# Patient Record
Sex: Male | Born: 1955
Health system: Western US, Academic
[De-identification: ages and names within clinical notes are randomized; demographics above are authoritative.]

---

## 2013-06-29 ENCOUNTER — Other Ambulatory Visit: Payer: Self-pay | Admitting: Family Medicine

## 2013-06-29 ENCOUNTER — Ambulatory Visit
Admission: RE | Admit: 2013-06-29 | Discharge: 2013-06-29 | Disposition: A | Discharge status: Home / Self Care | Payer: Managed Care, Other (non HMO) | Source: Ambulatory Visit | Attending: Family Medicine | Admitting: Family Medicine

## 2013-06-29 DIAGNOSIS — S63501A Unspecified sprain of right wrist, initial encounter: Secondary | ICD-10-CM

## 2014-08-04 DIAGNOSIS — R569 Unspecified convulsions: Secondary | ICD-10-CM

## 2015-11-18 DIAGNOSIS — R251 Tremor, unspecified: Secondary | ICD-10-CM

## 2017-04-19 DIAGNOSIS — Z6831 Body mass index (BMI) 31.0-31.9, adult: Secondary | ICD-10-CM

## 2017-10-31 DIAGNOSIS — E538 Deficiency of other specified B group vitamins: Secondary | ICD-10-CM

## 2018-03-10 DIAGNOSIS — Z7689 Persons encountering health services in other specified circumstances: Secondary | ICD-10-CM

## 2018-03-10 NOTE — Progress Notes
Patient: SRIHAAN MASTRANGELO  MRN: [id number]  Date of Service: 03/10/2018  PCP : No primary care provider on file.      CC:   Chief Complaint   Patient presents with   ??? Establish Care     headache x 2 months      History of Present Illness:     UZAIR GODLEY is a 62 y.o. male with  has no past medical history on file. presented to establish care    Patient complaint of constant headache for 2 months   Started while travelling in [place] and had a cold.   Since then has persistent pressure like headache on the bilateral temporal region.   Always have stress at work.   + neck pain.   No neurologic changes.   No blurry vision.   Ibuprofen help when taking it.     No hx of hypertension  Has also noted some hand tremors lately.   Family hx of brain tumor in father and uncles.       Patient has following chronic medical conditions:  1. Epilepsy : Grand mal since teenager.     On depakote 250 mg one in am and 2 in pm  Not follow by neurologist currently   Last visit about 10-15 yrs.   Stable on the medication.   No episodes.   Last valproic acid 55  10/2017      2. Hyperlipidemia  On lipitor 20 mg po qd  Last lab 4 month ago.   10/2017  TC 138  TG 122  LDL 74  HDL 40      3. Gout: wrist  allopuranol 300mg  po qd  No problem since on medication.       4. IBS with diarrhea    Was seen by Dr. Michael Litter  On metamucil    5. prediabete  Not on medication.   A1c : 5.9     6. Hx of colon polyp  04/29/2017 screening  Test : neg   09/11/2011 colonoscopy  Closer colonoscopy? 2-3 yrs ago.   Need record    7. Family hx of brain tumor.   Father at age 55s.   Uncle   Cousin.            10/2017 outside lab  Normal CMP  Normal CBC  TSH 2.8  Low normal b12 388  Normal folate level   Vitamin D = 41  FIT test 10/2017 = normal       Preventive Health Assessment:     Exercise is not adequate.    Seatbelts are used at all times.  Wynelle Link protection is used.  Tetanus/ immunization:   Vaccines:     Pneumovax    Prevnar    Zostavax    TDap Health Maintenance   Topic Date Due   ??? Hepatitis C Screening  10/20/1974   ??? HIV Screening  10/20/1974   ??? Shingles (Shingrix) Vaccine (1 of 2) 10/20/2006   ??? Annual Preventive Wellness Visit  10/20/2006   ??? Colon Ca Screening: COLONOSCOPY  10/20/2006   ??? Influenza Vaccine (1) 11/24/2017   ??? Tdap/Td Vaccine (3 - Td) 10/16/2026                       Past Medical History:      Past Medical History:   Diagnosis Date   ??? Epilepsy (HCC/RAF)    ??? Gout    ??? Hx of colonic polyp    ???  Hyperlipidemia    ??? IBS (irritable bowel syndrome)     diarrhea   ??? Prediabetes        Past Surgical History:      Past Surgical History:   Procedure Laterality Date   ??? TONSILLECTOMY         Allergy:  No Known Allergies     Medication:       Medications that the patient states to be currently taking   Medication Sig   ??? allopurinol 300 mg tablet TAKE 1 TABLET DAILY   ??? B Complex Vitamins (VITAMIN B COMPLEX) capsule Take 1 capsule by mouth daily.   ??? CALCIUM-VITAMIN D PO Take by mouth.   ??? cholecalciferol 10 mcg (400 units) tablet Take 400 Units by mouth daily.   ??? cyanocobalamin 1000 mcg tablet Take 1,000 mcg by mouth.   ??? divalproex 250 mg 24 hr tablet TAKE 1 TABLET 3 TIMES A DAY   ??? ferrous sulfate 325 (65 FE) mg EC tablet Take 325 mg by mouth three (3) times daily with meals.   ??? fish oil 1000 mg capsule Take by mouth.   ??? hyoscyamine 0.125 mg tablet Take 0.125 mg by mouth.   ??? ketoconazole 2% cream    ??? multivitamin tablet Take by mouth daily.   ??? simvastatin 20 mg tablet TAKE 1 TABLET NIGHTLY         Family History:   Family History   Problem Relation Age of Onset   ??? Depression Mother    ??? Meniere's disease Mother    ??? Brain cancer Father 66          Social History:      Social History     Patient does not qualify to have social determinant information on file (likely too young).   Social History Narrative    Married with step daughter.         Born in [place] to [place] 1991    Since then in [place] Work as Emergency planning/management officer in [employer].     Does lots of international travel before.               Review of Systems:    Review of Systems   Constitutional: Negative.    HENT: Negative.    Eyes: Negative.    Respiratory: Negative.    Cardiovascular: Negative.    Gastrointestinal: Negative.    Genitourinary: Negative.    Musculoskeletal: Negative.    Skin: Negative.    Neurological: Positive for tremors and headaches.   Endo/Heme/Allergies: Negative.    Psychiatric/Behavioral: Negative.            Physical Exam:     BP 146/78  ~ Pulse 80  ~ Temp 36.9 ???C (98.5 ???F) (Oral)  ~ Resp 16  ~ Ht 6' 0.32'' (1.837 m)  ~ Wt (!) 242 lb 12.8 oz (110.1 kg)  ~ SpO2 95%  ~ BMI 32.64 kg/m???       Physical Exam   Constitutional: He is oriented to person, place, and time and well-developed, well-nourished, and in no distress. No distress.   HENT:   Head: Normocephalic and atraumatic.   Neck: Muscular tenderness present. No spinous process tenderness present.       Cardiovascular: Normal rate, regular rhythm, normal heart sounds and normal pulses. Exam reveals no gallop.   No murmur heard.  Pulmonary/Chest: Effort normal and breath sounds normal. He has no decreased breath sounds. He has  no wheezes. He has no rhonchi. He has no rales.   Abdominal: Soft. Normal appearance and bowel sounds are normal. There is no hepatosplenomegaly. There is no abdominal tenderness. There is no rigidity, no rebound, no guarding, no CVA tenderness, no tenderness at McBurney's point and negative Murphy's sign. No hernia.   Musculoskeletal:      Thoracic back: Normal.      Lumbar back: Normal.   Neurological: He is alert and oriented to person, place, and time. He has normal sensation, normal strength, normal reflexes and intact cranial nerves. Gait normal.   Skin: Skin is warm and intact.   Psychiatric: Mood and affect normal.        LABS:    No results found for: HGBA1C    No results found for: VITD25OH No results found for: WBC, HGB, HCT, MCV, PLT  No results found for: CREAT, BUN, NA, K, CL, CO2  No results found for: HGBA1C  No results found for: ALT, AST, GGT, ALKPHOS, BILITOT  No results found for: TSH  No results found for: CALCIUM, PHOS  No results found for: CHOL, CHOLHDL, CHOLDLCAL, CHOLDLQ, TRIGLY  No results found for: INR, INRPOC, PT  Outside lab revied  10/2017 outside lab  Normal CMP  Normal CBC  TSH 2.8  Low normal b12 388  Normal folate level   Vitamin D = 41  FIT test 10/2017 = normal I have   [] reviewed radiology,  [x] reviewed labs, [] reviewed diag med test, [] reviewed & summarized old records, [] requested outside medical records.      IMPRESSION/PLAN:       1. Encounter to establish care with new doctor  - history review with patient  - obtain old record   - chart record, studies reviewed      2. Elevated BP without diagnosis of hypertension  + stress   I have discussed the potential causes, evaluation and treatment management in detail with patient.  - BP log at home  - dash diet  - avoid caffeinated drink and food.   - routine exercise  - if increase bp, will consider start medication.       3. Grand mal epilepsy, controlled (HCC/RAF)  Stable.   Continue with depakote  - Basic Metabolic Panel; Future  - MR brain wo+w contrast; Future  - TBOC - Venipuncture w/Collection & Handling  - Basic Metabolic Panel    4. Headache, unspecified headache type  For 2 months  Unclear etiology: may be multifactorial with tension headache vs CNS etiology.   Neurologic appears non focal on exam.   Currently no tremor noted.      Given history of seizure, family hx of brain tumor in father and hand tremor, will need to r/o cns etiology with imaging  Warm compress  nsaids prn pain.   ER warning signs discussed    - Basic Metabolic Panel; Future  - MR brain wo+w contrast; Future  - TBOC - Venipuncture w/Collection & Handling  - Basic Metabolic Panel    5. Tremor of both hands  Intermittent and new. - Basic Metabolic Panel; Future  - MR brain wo+w contrast; Future  - TBOC - Venipuncture w/Collection & Handling  - Basic Metabolic Panel    6. FH: brain tumor   father , uncle and cousin.     - Basic Metabolic Panel; Future  - MR brain wo+w contrast; Future  - TBOC - Venipuncture w/Collection & Handling  - Basic Metabolic Panel    7. Mixed  hyperlipidemia  Stable.   Continue with simvastatin     8. Idiopathic gout of wrist, unspecified chronicity, unspecified laterality  Stable   Continue with allopurinol     9. Prediabetes  Stable.   Diet control     10. Hx of colonic polyp  Last FIT test 10/2017.   Need colonoscopy info     11. Screening for HIV (human immunodeficiency virus)     - HIV-1/2 Ag/Ab 4th Generation with Reflex Confirmation; Future  - HIV-1/2 Ag/Ab 4th Generation with Reflex Confirmation    12. Need for hepatitis C screening test     - HCV Antibody Screen; Future  - HCV Antibody Screen    13. Need for vaccination for zoster  Prescription provided   - zoster vac recomb adjuvanted (SHINGRIX) 50 mcg injection; Administer 50 MCG IM now and repeat in two months.  Dispense: 1 each; Refill: 1    14. Need for influenza vaccination     - Influenza vaccine IM;   PF             RTC:  Return in about 4 weeks (around 04/08/2018) for follow up blood pressure, 30 minutes follow up.    Today's visit lasted more than 35 minutes of which greater than 50% was spent in counseling, coordination of care and a detailed question and answer session regarding the pathophysiology, etiology, and treatment options available including the risks and benefits related to the medical issues pertinent to this encounter.   The above plan of care, diagnosis, orders, and follow-up were discussed with the patient.  Questions related to this recommended plan of care were answered.              Woodfin Kiss S. Regino Schultze, MD 03/11/2018 5:55 PM  Assistant Clinical Professor  Internal Medicine  [facility] [address]  [place], [place] [address]  Office:[phone]

## 2018-03-11 ENCOUNTER — Ambulatory Visit: Payer: BLUE CROSS/BLUE SHIELD

## 2018-03-11 DIAGNOSIS — M10039 Idiopathic gout, unspecified wrist: Secondary | ICD-10-CM

## 2018-03-11 DIAGNOSIS — R251 Tremor, unspecified: Secondary | ICD-10-CM

## 2018-03-11 DIAGNOSIS — G40409 Other generalized epilepsy and epileptic syndromes, not intractable, without status epilepticus: Secondary | ICD-10-CM

## 2018-03-11 DIAGNOSIS — R030 Elevated blood-pressure reading, without diagnosis of hypertension: Secondary | ICD-10-CM

## 2018-03-11 DIAGNOSIS — E782 Mixed hyperlipidemia: Secondary | ICD-10-CM

## 2018-03-11 DIAGNOSIS — Z8489 Family history of other specified conditions: Secondary | ICD-10-CM

## 2018-03-11 DIAGNOSIS — Z1159 Encounter for screening for other viral diseases: Secondary | ICD-10-CM

## 2018-03-11 DIAGNOSIS — Z114 Encounter for screening for human immunodeficiency virus [HIV]: Secondary | ICD-10-CM

## 2018-03-11 DIAGNOSIS — Z86010 Personal history of colonic polyps: Secondary | ICD-10-CM

## 2018-03-11 DIAGNOSIS — R51 Headache: Secondary | ICD-10-CM

## 2018-03-11 DIAGNOSIS — Z23 Encounter for immunization: Secondary | ICD-10-CM

## 2018-03-11 DIAGNOSIS — R7303 Prediabetes: Secondary | ICD-10-CM

## 2018-03-11 MED ORDER — ZOSTER VAC RECOMB ADJUVANTED 50 MCG/0.5ML IM SUSR
1 refills | Status: AC
Start: 2018-03-11 — End: ?

## 2018-03-11 NOTE — Patient Instructions
[facility] allows you to send messages to your care team, view your test results, renew your prescriptions, schedule appointments, and more. To sign up, log on to [email or web address]. Once you are logged on, click on the Sign Up Now link and you will access the new member sign-up page. Enter your [email or web address] Activation Code exactly as it appears below to complete the sign-up process. If you do not sign up before the expiration date, you must request a new code.    [facility] Activation Code: [email or web address]  Expires: 05/10/2018  3:45 PM    If you have questions, call our Patient Support Line at [phone]. Remember, [facility] is NOT to be used for urgent needs. For medical emergencies, dial 911.      Thank you for your visit.   My regular work hours are Tues, Thursday and Friday from 8:00am - 5:00 pm.    If you have any non urgent messages, please send me a message either through email or call.  I will get back to you on my clinic days.   If you need to be seen when I am not available, the call center will assist you to find an available [facility] physician to help with your health concern/issues.      [facility]:  Extended hour service     Monday to Friday 5:00 pm-9:00 pm  Saturday 9:00am to 1:00pm  Call for holiday hours  [address]  [place], [place] [address]  [phone]        .I have ordered MRI brain for further evaluation.       Please call Radiology at to make an appointment at any of the following location and number.    [phone]  For general [place] evaluation at following location        [phone] [facility] Radiology  [address]  [facility] and [facility]    [address]  [place], [place] [address]          [facility] Radiology Central Scheduling   [phone]   Hours of Operation:   7:00 am - 7:00 pm, Mon - Fri   [email or web address] Your blood pressure is elevated this visit:    -  Please monitor your blood pressure at home, and bring your blood pressure log to your next visit  -  The best time to check your blood pressure is either first in the morning after urinating, or right before bedtime  - Check BP once a day, no more than twice a day  -  Try decreasing the amount of salt in your diet (increased salt leads to water retention)-- Esp watch dining out and processed foods!  -  Do your best to exercise regularly. You should aim for at least 30 mins of brisk exercise, 3-5 times a week (pushing yourself so that you break a sweat or become breathless)      Headache, Unspecified    A number of things can cause headaches. The cause of your headache isn???t clear. But it doesn???t seem to be a sign of any serious illness.  You could have a tension headache or a migraine headache.  Stress can cause a tension headache. This can happen if you tense the muscles of your shoulders, neck, and scalp without knowing it. If this stress lasts long enough, you may develop a tension headache.  It is not clear why  migraines occur, but certain things called'' triggers'' can raise the risk of having a migraine attack. Migraine triggers may include emotional stress or depression, or by hormone changes during the menstrual cycle. Other triggers include birth control pills and other medicines, alcohol or caffeine, foods with tyramine (such as aged cheese, wine), eyestrain, weather changes, missed meals, and lack of sleep or oversleeping.  Other causes of headache include:  ??? Viral illness with high fever  ??? Head injury with concussion  ??? Sinus, ear, or throat infection  ??? Dental pain and jaw joint (TMJ) pain  More serious but less common causes of headache include stroke, brain hemorrhage, brain tumor, meningitis, and encephalitis.  Home care  Follow these tips when taking care of yourself at home:  ??? Don???t drive yourself home if you were given pain medicine for your headache. Instead, have someone else drive you home. Try to sleep when you get home. You should feel much better when you wake up.  ??? Apply heat to the back of your neck to ease a neck muscle spasm. Take care of a migraine headache by putting an ice pack on your forehead or at the base of your skull.  ??? If you have nausea or vomiting, eat a light diet until your headache eases.  ??? If you have a migraine headache, use sunglasses when in the daylight or around bright indoor lighting until your symptoms get better. Bright glaring light can make this type of headache worse.  Follow-up care  Follow up with your healthcare provider, or as advised. Talk with your provider if you have frequent headaches. He or she can help figure out a treatment plan. By knowing the earliest signs of headache, and starting treatment right away, you may be able to stop the pain yourself.  When to seek medical advice  Call your healthcare provider right away???if any of these occur:  ??? Your head pain suddenly gets worse after sexual intercourse or strenuous activity  ??? Your head pain doesn???t get better within 24 hours  ??? You aren???t able to keep liquids down (repeated vomiting)  ??? Fever of 100.4???F (38???C) or higher, or as directed by your healthcare provider  ??? Stiff neck  ??? Extreme drowsiness, confusion, or fainting  ??? Dizziness or dizziness with spinning sensation (vertigo)  ??? Weakness in an arm or leg or one side of your face  ??? You have trouble talking or seeing  Date Last Reviewed: 10/25/2014  ??? 2000-2018 [employer], [place]. [address] [place], [place] [address]. All rights reserved. This information is not intended as a substitute for professional medical care. Always follow your healthcare professional's instructions.

## 2018-03-12 LAB — Basic Metabolic Panel
ANION GAP: 16 mmol/L (ref 8–19)
TOTAL CO2: 23 mmol/L (ref 20–30)

## 2018-03-12 LAB — HIV-1/2 Ag/Ab 4th Generation with Reflex Confirmation: HIV-1/2 AG/AB 4TH GENERATION WITH REFLEX CONFIRMATION: NONREACTIVE

## 2018-03-12 LAB — HCV Ab Screen: HCV ANTIBODY SCREEN: NONREACTIVE

## 2018-03-12 NOTE — Nursing Note
Per doctors orders, patient received Fluarix Quadrivalent vaccine. Patient tolerated well, no pain noted or stated by patient. Right arm area.     Provided patient with Fluarix Quadrivalent VIS.

## 2018-04-22 ENCOUNTER — Ambulatory Visit: Payer: BLUE CROSS/BLUE SHIELD

## 2018-04-23 ENCOUNTER — Ambulatory Visit: Payer: BLUE CROSS/BLUE SHIELD

## 2018-04-23 ENCOUNTER — Inpatient Hospital Stay: Payer: BLUE CROSS/BLUE SHIELD

## 2018-04-23 DIAGNOSIS — Z8489 Family history of other specified conditions: Secondary | ICD-10-CM

## 2018-04-23 DIAGNOSIS — R251 Tremor, unspecified: Secondary | ICD-10-CM

## 2018-04-23 DIAGNOSIS — R51 Headache: Secondary | ICD-10-CM

## 2018-04-23 DIAGNOSIS — G40409 Other generalized epilepsy and epileptic syndromes, not intractable, without status epilepticus: Secondary | ICD-10-CM

## 2018-04-23 MED ORDER — DIVALPROEX SODIUM ER 250 MG PO TB24
ORAL_TABLET | 1 refills | Status: AC
Start: 2018-04-23 — End: 2018-11-16

## 2018-04-23 MED ORDER — SIMVASTATIN 20 MG PO TABS
ORAL_TABLET | 1 refills | Status: AC
Start: 2018-04-23 — End: 2018-11-16

## 2018-04-23 MED ORDER — ALLOPURINOL 300 MG PO TABS
ORAL_TABLET | 1 refills | Status: AC
Start: 2018-04-23 — End: 2018-11-16

## 2018-04-24 ENCOUNTER — Ambulatory Visit: Payer: BLUE CROSS/BLUE SHIELD

## 2018-04-24 MED ADMIN — GADOBUTROL 1 MMOL/ML IV SOLN: 10 mL | INTRAVENOUS | @ 01:00:00 | Stop: 2018-04-24 | NDC 50419032512

## 2018-09-01 ENCOUNTER — Ambulatory Visit: Payer: BLUE CROSS/BLUE SHIELD

## 2018-09-01 DIAGNOSIS — Z86010 Personal history of colonic polyps: Secondary | ICD-10-CM

## 2018-09-01 DIAGNOSIS — K589 Irritable bowel syndrome without diarrhea: Secondary | ICD-10-CM

## 2018-09-01 NOTE — Telephone Encounter
Referral has been faxed to GI office-digestive disease at [phone].

## 2018-09-01 NOTE — Telephone Encounter
Dr.Wang,  Do you need an appointment prior to referral? Last seen 02/2018.

## 2018-09-11 ENCOUNTER — Ambulatory Visit: Payer: BLUE CROSS/BLUE SHIELD

## 2018-09-12 ENCOUNTER — Telehealth: Payer: BLUE CROSS/BLUE SHIELD

## 2018-09-12 ENCOUNTER — Ambulatory Visit: Payer: BLUE CROSS/BLUE SHIELD

## 2018-09-12 DIAGNOSIS — R6889 Other general symptoms and signs: Secondary | ICD-10-CM

## 2018-09-12 DIAGNOSIS — J029 Acute pharyngitis, unspecified: Secondary | ICD-10-CM

## 2018-09-12 MED ORDER — AZITHROMYCIN 250 MG PO TABS
ORAL_TABLET | 0 refills | Status: AC
Start: 2018-09-12 — End: ?

## 2018-09-12 NOTE — Progress Notes
The coronavirus prepareness and response supplemental appropriation Act of 2020, this visit was conducted via telecommunication via phone/ video with patient's verbal consent after verifying their name and date of birth.    Patient Consent to Telehealth Questionnaire   [facility] TELEHEALTH PRECHECKIN QUESTIONS 09/11/2018   By clicking ''I Agree'', I consent to the below:  I Agree     - I agree  to be treated via a video visit and acknowledge that I may be liable for any relevant copays or coinsurance depending on my insurance plan.  - I understand that this video visit is offered for my convenience and I am able to cancel and reschedule for an in-person appointment if I desire.  - I also acknowledge that sensitive medical information may be discussed during this video visit appointment and that it is my responsibility to locate myself in a location that ensures privacy to my own level of comfort.  - I also acknowledge that I should not be participating in a video visit in a way that could cause danger to myself or to those around me (such as driving or walking).  If my provider is concerned about my safety, I understand that they have the right to terminate the visit.              PATIENT: Joseph Donovan  MRN: [id number]  DOB: 1956-01-13  DATE OF SERVICE: 09/12/2018  PRIMARY CARE PROVIDER: Garret Reddish., MD    Chief Complaint   Patient presents with   ??? Follow-up     sorethroat x 1 week / earache x 2 days        HPI:   Joseph Donovan is a very pleasant 63 y.o. male with the below past medical history who presents for the following:     Patient complaint of sore throat for 1 week   Ear ache for 2 days but got better.   + difficult with swallowing.   + fatigue.   No fever.   Felt clammy.   No myalgia.   Has been taking ibuprofen with help the pain.   Able to swallow better now.   Okay with liquid.   No ill contact.   Does go out to shopping.    Past History:     Past Medical History:   Diagnosis Date   ??? Epilepsy (HCC/RAF) ??? Gout    ??? Hx of colonic polyp    ??? Hyperlipidemia    ??? IBS (irritable bowel syndrome)     diarrhea   ??? Prediabetes      Past Surgical History:   Procedure Laterality Date   ??? TONSILLECTOMY         Medications:     Outpatient Medications Prior to Visit   Medication Sig   ??? allopurinol 300 mg tablet TAKE 1 TABLET DAILY.   ??? B Complex Vitamins (VITAMIN B COMPLEX) capsule Take 1 capsule by mouth daily.   ??? CALCIUM-VITAMIN D PO Take by mouth.   ??? cholecalciferol 10 mcg (400 units) tablet Take 400 Units by mouth daily.   ??? cyanocobalamin 1000 mcg tablet Take 1,000 mcg by mouth.   ??? divalproex 250 mg 24 hr tablet TAKE 1 TABLET in am and 2 tablets in pm.   ??? ferrous sulfate 325 (65 FE) mg EC tablet Take 325 mg by mouth three (3) times daily with meals.   ??? fish oil 1000 mg capsule Take by mouth.   ??? hyoscyamine 0.125 mg tablet Take  0.125 mg by mouth.   ??? ketoconazole 2% cream    ??? multivitamin tablet Take by mouth daily.   ??? simvastatin 20 mg tablet TAKE 1 TABLET NIGHTLY.   ??? zoster vac recomb adjuvanted (SHINGRIX) 50 mcg injection Administer 50 MCG IM now and repeat in two months.     No facility-administered medications prior to visit.      No Known Allergies    Family History:     Family History   Problem Relation Age of Onset   ??? Depression Mother    ??? Meniere's disease Mother    ??? Brain cancer Father 71       Social History:    reports that he quit smoking about 11 years ago. His smoking use included cigarettes. He has a 20.00 pack-year smoking history. He has never used smokeless tobacco. He reports current alcohol use. He reports previous drug use.  Social History     Social History Narrative    Married with step daughter.         Born in [place] to [place] 1992    Since then in [place]        Work as Emergency planning/management officer in [employer].     Does lots of international travel before.       No LMP for male patient.       Review of Systems:       Pertinent items are noted in HPI.  Review of Systems Constitutional: Negative.    HENT: Negative.    Eyes: Negative.    Respiratory: Negative.    Cardiovascular: Negative.    Gastrointestinal: Negative.    Genitourinary: Negative.    Musculoskeletal: Negative.    Skin: Negative.    Neurological: Negative.    Endo/Heme/Allergies: Negative.    Psychiatric/Behavioral: Negative.        Objective:       Due to nature of the visit, vitals were not taken.    Physical Exam   Constitutional: He is oriented to person, place, and time and well-developed, well-nourished, and in no distress.   HENT:   Head: Normocephalic and atraumatic.   Neck: Neck supple.   Pulmonary/Chest: Effort normal.   Neurological: He is alert and oriented to person, place, and time.   Psychiatric: Mood and affect normal.              Assessment & Plan:   KHALIN ROYCE is a 63 y.o. male presenting with concern for COVID-19 and associated symptoms.    Diagnoses and all orders for this visit:    Suspected Covid-19 Virus Infection  -     COVID-19 PCR, Nasopharyngeal; Future; Expected date: 09/12/2018    Acute pharyngitis, unspecified etiology  Given his symptom, could potentially be strep pharyngitis  Will try antibiotic.   - salt water gargle  - encourages fluid and rest  - avoid sharing food.   - info on pharyngitis given  - ER warning signs given      Other orders  -     azithromycin 250 mg tablet; Take 2 tablets (500 mg) on  Day 1,  followed by 1 tablet (250 mg) once daily on Days 2 through 5..      Orders Placed This Encounter   ??? COVID-19 PCR, Nasopharyngeal   ??? azithromycin 250 mg tablet     [x]  Patient referred for COVID testing.  []  Nasopharyngeal swab samples obtained for in clinic COVID testing.  []   Patient directed to go immediately to the local Emergency Department for further evaluation and possible hospitalization.  [x] Patient provided with information regarding the need to isolate at home, social distancing, practice strict hygiene, and monitor for signs and symptoms of worsening health conditions including respiratory difficulties.  [x] Additional education provided to patient on symptom management and follow up should they develop worsening symptoms including respiratory difficulties. Patient was also advised to inform healthcare providers and emergency responders of their current screening status should additional treatment be needed.  [] Risks/benefits of medications discussed.  []  >50% of this 15 mins visit was spent on direct patient care, including counseling and/or coordination of care for the problems listed within my assessment and plan.    The plan of care, diagnosis, orders, risks and benefits related to the medical issues pertinent to this encounter, and follow-up recommendations were discussed with the patient. Questions related to the recommended plan of care were answered. See AVS for additional information and counseling materials provided to the patient.    Time of note filed does not necessarily reflect the time of encounter.  Portions of this note may have been created with voice recognition software. Occasional wrong-word or ''sound-alike'' substitutions may have occurred due to the inherent limitations of voice recognition software. Please read the chart carefully and recognize, using context, where these substitutions have occurred.  --  Author:  Wayne Both. Regino Schultze, MD  09/12/2018 at 9:13 AM  Jess Sulak S. Regino Schultze, MD 09/12/2018 9:13 AM  Assistant Clinical Professor  Internal Medicine  [facility]  [address], [address]  [place], [place] [address]  [phone]        Per H.R. [phone], Division B, Section 101 and 102, the Coronavirus Preparedness and Response Supplemental Appropriations Act of 2020, this visit was conducted via audio telecommunication with the patient???s verbal consent after verifying their name and date of birth. Vitals were not taken during this telemedicine appointment.   The duration of the telemedicine visit was 15 minutes.

## 2018-09-12 NOTE — Patient Instructions
Please call [phone] to schedule lab test for Covid 19      Suspected Coronavirus-2019 (COVID-19) Infection Home Isolation Instructions   You are suspected of having COVID-19   ? If you have been in close contact with someone who has COVID-19 it is possible that you are also infected.  Close contact means you have been exposed to:  o Someone who tested positive for COVID-19.      o Someone with COVID-19 symptoms who was in close contact with COVID-19 in the last 14 days.    o Someone who was told by their care provider that they probably have COVID-19.     If you are suspected of having COVID-19 you must self-quarantine or (isolate at home) because:  o It can take 2???14 days to show symptoms, so we may not know for up to 14 days if you are infected or not.   o In case you are infected, you need to stay home so that you don???t pass on the infection to anyone else.   How long do you need to self-quarantine or (isolate at home)?  ? Your last day of quarantine is 14 days from when you were last in contact with the person with COVID-19.   ? If you continue to live with and/or care for the person with COVID-19, the quarantine guidance is as follows:  o Your quarantine will end 14 days after the household started to follow the Home Isolation Instructions.   o If there is close contact with a person with COVID-19 (being within 6 feet for more than 10 minutes or touching body fluids or secretions without using disposable gloves) the 14-day quarantine period will have to restart.   ? Body fluids or secretions include sweat, saliva, sputum, nasal mucus, vomit, urine, or diarrhea.   o If you are unable to avoid close contact, you should stay in quarantine for 14 days after the person with COVID-19 was told they were ???cleared??? to stop their own isolation. This is likely to be at least 21 days.      If you have been tested for the COVID-19 virus.   ? Be sure that your care provider has your phone number so they can call you with your test results.  ? Sign up for [facility] and you will get your test results there.  o It takes 24 hours to get your COVID-19 test results.   o If you have not gotten your test results after 24 hours, contact your [facility] care provider.   o If you are a Research scientist (physical sciences), notify your manager that you are waiting for COVID-19 test results and follow their instructions.  What if you develop symptoms?  ? If you develop any symptoms you may have COVID-19  ? Symptoms includes:  o Fever greater than 100 F, take your temperature twice a day  o Cough  o Shortness of breath or difficulty breathing  o Chills  o Repeated shaking with chills  o Muscle pain  o Headache  o Sore throat  o New loss of taste or smell   ? Most people with COVID-19 will have mild illness and can get better at home care and without the need to see a health care provider.   o If you are 65 years and older, pregnant, or have a health problem such as a chronic disease or a weak immune system, you should let your doctor know about your symptoms.   o You do not  need to be tested just to confirm infection.   o You do need to remain home for at least 10 days from the onset of symptoms AND 72 hours (3 full days) after your fever is completely gone without fever reducing medicines and your respiratory symptoms are better.  o Call your health care provider if you have concerns or questions about the need for testing.   o Follow the guidance Home Care Instructions for People with Respiratory Symptoms, found on the [facility] department of Public Health COVID-19 website.  There is no specific treatment for the COVID-19 virus, but here is what you can do to feel better:  ? Rest  ? Drink plenty of fluids.  ? Take acetaminophen (Tylenol???) to reduce fever and pain.   o If you have liver disease, ask you care provider first before taking Tylenol???.  ? Children under 60 years old, should not be given any over-the-counter cold medications without first speaking with your care provider.  ? Medicines (like Tylenol???) can help you feel better, but they are not a cure.   If symptoms worsen or continue and you need to seek medical care.  ? Call your healthcare provider in advance, or 9-1-1 in an emergency, and let them know you are a close contact to a person with confirmed COVID-19.  When should you get medical help?  ? If your symptoms get worse, get medical help.  o Especially if you are at higher risk of serious illness, including:  ? People age 21 and older  ? If you are pregnant  ? If you have a health problem, a chronic disease or a weak immune system.  ? If you develop any of these emergency warning signs for COVID-19 get medical attention immediately:  o Trouble breathing  o Persistent pain or pressure in the chest  o New confusion or inability to arouse  o Bluish lips or face  o Can???t keep fluids down (vomiting)  o Dehydration (dry mouth, less urine)  o Other serious symptoms  For a medical emergency call 911 immediately!  ? Tell the 911 operator that you may have COVID-19.  ? If possible, put on a cloth face covering before the ambulance arrives.  ? Tell all the people who are helping you that you may have COVID-19.   If your symptoms are mild and not life threatening   ? Call your health care provider or call [phone] to schedule a video visit with a [facility] physician.  o The CDC recommends video visits to reduce the risk of spreading the virus.  ? If you go to a doctor???s office, clinic or hospital, wear a cloth face covering to protect others from getting sick.  How to keep others from getting sick  ? Try and stay away from people (at least 6 feet) as much as you can.   ? Wear a cloth face covering when you are around people at any distance.  o If you are traveling in a car with someone, wear a mask and open the car windows to reduce the risk of exposing others. ? It is very important to stay away from people, especially if they are 65 years and older, pregnant or have a health problem such as a chronic disease or a weak immune system.  ? Cover your cough and sneezes.  ? Wash your hands often with soap and water for at least 20 seconds or if hands don???t look dirty, you can clean then with  an alcohol-based hand sanitizer that contains at least 60% alcohol.  ? At home, try to stay in one specific room and keep away from other people.   ? Use a separate bathroom if you can.    ? Do not allow visitors and limit the number of people in your home.  ? Try to stay at least 6 feet away from others   ? Do not kiss or hug others  ? Do not handle pets or other animals.   ? Do not prepare or serve food to others.  ? Avoid caring for children if possible.   ? Do not share personal items, like dishes, towels, or a bed with anyone.  ? Clean all surfaces that are touched often in your home, like counters, tabletops, and doorknobs at least once a day.  ? Clean commonly used electronics, like a remote, phone, keyboard or tablet at least once a day.  o Use household cleaning sprays or wipes often to keep surfaces clean    If you have someone helping you at home    ? They need to wear a cloth face covering and gloves when they are in your room or near you.   o They need to wear disposable gloves if they clean your room or bathroom or come into contact with your body fluids, and/or secretions (such as sweat, saliva, sputum, nasal mucus, vomit, urine, or diarrhea).    After they finish caring for you, they need to follow these steps in this order:   1. Remove and dispose of their gloves.   2. Wash their hands for at least 20 seconds with soap and water, or with an alcohol-based hand sanitizer (at least 60% alcohol) if hands don???t look dirty.   3. Remove their cloth face covering.  4. Wash their hands again for at least 20 seconds with soap and water, or with an alcohol-based hand sanitizer (at least 60% alcohol).   If anyone else in your home gets sick too, they should:  ? Begin to self-quarantine (isolate at home) too.  ? Contact their [place] care provider or call [phone] to schedule a video visit with a [facility] physician.   o The CDC recommends video visits to reduce the risk of spreading the virus.  ? Your last day of self-quarantine is 14 days from when you were last in contact with the person with COVID-19.   ? If you continue to live with and/or care for the person with COVID-19, the quarantine guidance is as follows:  o Your self-quarantine will end 14 days after the household started to follow the Home Isolation Instructions.  o Home Isolation Instructions can be found on the [facility] county department of Public Health website [email or web address].[email or web address]  o If you have close contact with a person with COVID-19 (being within 6 feet for more than 10 minutes or touching body fluids or secretions without using gloves and handwashing) your 14-day self-quarantine period will have to restart.   How to end self-quarantine or home isolation:  If you were self-quarantined for COVID-19 you can leave home and home isolation after these three things have happened:   1. You have had no fever for at least 72 hours (that is three full days of no fever without the use of fever reducing medicine).  2. Other symptoms (cough or shortness of breath) have improved.  3. At least 10 days have passed since your symptoms first appeared.    For more information visit:  [facility] COVID-19 Information  Call 2-1-1  [email or web address]    [facility] COVID-19 Hotline  Available 8am-5pm M-F for patient questions regarding COVID-19  [phone]    [facility]  [address], [place], [place] [address]  [phone]    [facility]  [address], [place], [place] [address]   [phone] Pharyngitis: Strep (Presumed)    You have pharyngitis (sore throat). The healthcare staff think your sore throat is caused by streptococcus (strep)???bacteria.???This is often called strep throat. Strep throat can cause throat pain that is worse when swallowing, aching all over, headache,???and fever. The infection is contagious. It may be spread by coughing, kissing,???or touching others after touching your mouth or nose. Antibiotic medicine is given to treat the infection.  Home care  ??? Rest at home. Drink plenty of fluids so you won???t get dehydrated.  ??? Stay home from work or school for the first 2 days of taking the antibiotics. After this time, you will not be contagious. You can then return to work or school if you are feeling better.???  ??? Take the antibiotic medicine for the full 10 days, even when you feel better. This is very important to make sure the infection is fully treated. It is also important to prevent medicine-resistant germs from growing.???If you were given an antibiotic shot, no more antibiotics are needed.  ??? You may use acetaminophen or ibuprofen to control pain or fever, unless another medicine was prescribed for this. If you have chronic liver or kidney disease or ever had a stomach ulcer or GI bleeding, talk with your healthcare provider before using these medicines.  ??? Use throat lozenges or a throat-numbing spray to help reduce throat pain. Gargling with warm salt water can also help reduce throat pain. Dissolve 1/2 teaspoon of salt in 1 glass of warm water.???  ??? Don???t eat salty or spicy foods. These can irritate the throat.  Follow-up care  Follow up with your healthcare provider or our staff if you don't get better over the next week.  When to seek medical advice  Call your healthcare provider right away if any of these occur:  ??? Fever???as directed by your healthcare provider  ??? New or worse ear pain, sinus pain, or headache  ??? Painful lumps in the back of neck  ??? Stiff neck ??? Lymph nodes that get larger  ??? Can???t swallow liquids, a lot of drooling,???or can???t open mouth wide due to throat pain  ??? Signs of dehydration, such as very dark urine or no urine, sunken eyes, dizziness  ??? Trouble breathing or noisy breathing  ??? Muffled voice  ??? New rash  Prevention  Here are steps you can take to help prevent an infection:  ??? Keep good hand washing habits.  ??? Don???t have close contact with people who have sore throats, colds, or other upper respiratory infections.  ??? Don???t smoke, and stay away from secondhand smoke.  ??? Stay up to date with of your vaccines.  Date Last Reviewed: 01/25/2016  ??? 2000-2018 [employer] [place]. [address], [place], [place] [address]. All rights reserved. This information is not intended as a substitute for professional medical care. Always follow your healthcare professional's instructions.

## 2018-09-22 ENCOUNTER — Ambulatory Visit: Payer: BLUE CROSS/BLUE SHIELD

## 2018-11-15 MED ORDER — ALLOPURINOL 300 MG PO TABS
ORAL_TABLET | 1 refills | Status: AC
Start: 2018-11-15 — End: ?

## 2018-11-15 MED ORDER — DIVALPROEX SODIUM ER 250 MG PO TB24
ORAL_TABLET | 1 refills | Status: AC
Start: 2018-11-15 — End: ?

## 2018-11-15 MED ORDER — SIMVASTATIN 20 MG PO TABS
ORAL_TABLET | 1 refills | Status: AC
Start: 2018-11-15 — End: ?

## 2018-11-19 ENCOUNTER — Telehealth: Payer: BLUE CROSS/BLUE SHIELD

## 2018-11-19 NOTE — Telephone Encounter
Prior Auth for divalproex submitted via CoverMyMeds. Awaiting for response. May take 24-72 hours.    Key: [id number]  PA Case ID: [id number]  Rx #: [id number]

## 2018-11-21 NOTE — Telephone Encounter
PA approved. Called [employer] and updated status.

## 2018-12-22 DIAGNOSIS — K589 Irritable bowel syndrome without diarrhea: Secondary | ICD-10-CM

## 2018-12-22 DIAGNOSIS — R7303 Prediabetes: Secondary | ICD-10-CM

## 2018-12-22 DIAGNOSIS — Z86010 Personal history of colonic polyps: Secondary | ICD-10-CM

## 2018-12-22 DIAGNOSIS — G40409 Other generalized epilepsy and epileptic syndromes, not intractable, without status epilepticus: Secondary | ICD-10-CM

## 2018-12-22 DIAGNOSIS — Z0000 Encounter for general adult medical examination without abnormal findings: Secondary | ICD-10-CM

## 2018-12-22 DIAGNOSIS — E782 Mixed hyperlipidemia: Secondary | ICD-10-CM

## 2018-12-22 DIAGNOSIS — M10031 Idiopathic gout, right wrist: Secondary | ICD-10-CM

## 2018-12-22 NOTE — Addendum Note
Addended by: Lujean Rave S on: 12/22/2018 10:24 AM     Modules accepted: Orders

## 2018-12-22 NOTE — Progress Notes
Patient: Joseph Donovan  MRN: [id number]  Date of Service: 12/22/2018  PCP : Garret Reddish., MD      CC:   No chief complaint on file.    History of Present Illness:     Joseph Donovan is a 63 y.o. male with  has a past medical history of Epilepsy (HCC/RAF), Gout, colonic polyp, Hyperlipidemia, IBS (irritable bowel syndrome), and Prediabetes. presented for annual physical .    Last visit 02/2018  Telemedicine visit 08/2017    Overall feeling ***         Patient has following chronic medical conditions:  1. Epilepsy : Grand mal since teenager.     On depakote 250 mg one in am and 2 in pm  Not follow by neurologist currently   Last visit about 10-15 yrs.   Stable on the medication.   No episodes.   Last valproic acid 55  10/2017      2. Hyperlipidemia  On lipitor 20 mg po qd  Last lab 4 month ago.   10/2017  TC 138  TG 122  LDL 74  HDL 40      3. Gout: wrist  allopuranol 300mg  po qd  No problem since on medication.       4. IBS with diarrhea    Was seen by Dr. Michael Litter  On metamucil    5. prediabete  Not on medication.   A1c : 5.9     6. Hx of colon polyp  04/29/2017 screening  Test : neg   09/11/2011 colonoscopy  Closer colonoscopy? 2-3 yrs ago.   Need record    7. Family hx of brain tumor.   Father at age 65s.   Uncle   Cousin.   Sp MRI brain 02/2018: normal           FIT test 10/2017 = normal       Preventive Health Assessment:     Exercise is *** adequate.    Seatbelts are used at all times.  Wynelle Link protection is used.  Tetanus/ immunization:   Vaccines:     Pneumovax    Prevnar    Zostavax    TDap       Health Maintenance   Topic Date Due   ? Shingles (Shingrix) Vaccine (1 of 2) 10/20/2006   ? Annual Preventive Wellness Visit  10/20/2006   ? Colon Ca Screening: COLONOSCOPY  10/20/2006   ? Influenza Vaccine (1) 11/25/2018   ? Tdap/Td Vaccine (3 - Td) 10/16/2026   ? Hepatitis C Screening  Completed   ? HIV Screening  Completed                       Past Medical History:      Past Medical History:   Diagnosis Date ? Epilepsy (HCC/RAF)    ? Gout    ? Hx of colonic polyp    ? Hyperlipidemia    ? IBS (irritable bowel syndrome)     diarrhea   ? Prediabetes        Past Surgical History:      Past Surgical History:   Procedure Laterality Date   ? TONSILLECTOMY         Allergy:  No Known Allergies     Medication:       No outpatient medications have been marked as taking for the 12/26/18 encounter (Appointment) with Garret Reddish., MD.  Family History:   Family History   Problem Relation Age of Onset   ? Depression Mother    ? Meniere's disease Mother    ? Brain cancer Father 68          Social History:    reports that he quit smoking about 11 years ago. His smoking use included cigarettes. He has a 20.00 pack-year smoking history. He has never used smokeless tobacco. He reports current alcohol use. He reports previous drug use.  Social History     Social History Narrative    Married with step daughter.         Born [place] to [place]    Since then in [place]        Work as Emergency planning/management officer in [employer].     Does lots of international travel before.               Review of Systems:    Review of Systems   Constitutional: Negative.    HENT: Negative.    Eyes: Negative.    Respiratory: Negative.    Cardiovascular: Negative.    Gastrointestinal: Negative.    Genitourinary: Negative.    Musculoskeletal: Negative.    Skin: Negative.    Neurological: Positive for tremors and headaches.   Endo/Heme/Allergies: Negative.    Psychiatric/Behavioral: Negative.            Physical Exam:     There were no vitals taken for this visit.      Physical Exam     LABS:    No results found for: HGBA1C    No results found for: VITD25OH  No results found for: WBC, HGB, HCT, MCV, PLT  Lab Results   Component Value Date    CREAT 0.99 03/11/2018    BUN 17 03/11/2018    NA 141 03/11/2018    K 4.8 03/11/2018    CL 102 03/11/2018    CO2 23 03/11/2018     No results found for: HGBA1C  No results found for: ALT, AST, GGT, ALKPHOS, BILITOT No results found for: TSH  Lab Results   Component Value Date    CALCIUM 9.8 03/11/2018     No results found for: CHOL, CHOLHDL, CHOLDLCAL, CHOLDLQ, TRIGLY  No results found for: INR, INRPOC, PT   Outside lab revied  10/2017 outside lab  Normal CMP  Normal CBC  TSH 2.8  Low normal b12 388  Normal folate level   Vitamin D = 41  FIT test 10/2017 = normal     I have   [] reviewed radiology,  [x] reviewed labs, [] reviewed diag med test, [] reviewed & summarized old records, [] requested outside medical records.      IMPRESSION/PLAN:       1. Encounter for annual physical   ***  PHQ 9 = ***  AudIt = ***  Mammogram = ***  Pap = ***  DEXA = ***  Colonoscopy = ***  Fall risk : ***  Memory : ***  Encourage healthy diet and routine exercise.  Seatbelt, sunscreen and dental care discussed.   Vitamin D and Calcium supplement discussed for bone health.       HCV Ab ( DOB 1944-65): ***  Low-dose High Res CT scan of chest for lung ca:  ( 55-74 yo smoker): ***  AAA 65: ***           2. Elevated BP without diagnosis of hypertension  + stress  I have discussed the potential causes, evaluation and treatment management in detail with patient.  - BP log at home  - dash diet  - avoid caffeinated drink and food.   - routine exercise  - if increase bp, will consider start medication.       3. Grand mal epilepsy, controlled (HCC/RAF)  ***  Stable.   Continue with depakote    Check valproic acid level        5. Tremor of both hands  Intermittent and new. ?    - Basic Metabolic Panel; Future  - MR brain wo+w contrast; Future  - TBOC - Venipuncture w/Collection & Handling  - Basic Metabolic Panel    6. FH: brain tumor   father , uncle and cousin.     - Basic Metabolic Panel; Future  - MR brain wo+w contrast; Future  - TBOC - Venipuncture w/Collection & Handling  - Basic Metabolic Panel    7. Mixed hyperlipidemia  Stable.   Continue with simvastatin     8. Idiopathic gout of wrist, unspecified chronicity, unspecified laterality  Stable Continue with allopurinol     9. Prediabetes  Stable.   Diet control     10. Hx of colonic polyp  Last FIT test 10/2017.   Need colonoscopy info - Differential, Automated    2. Grand mal seizure (HCC/RAF)  Has been stable for more than 10 yrs  Patient not interested to stop the depakote or see neurologist at this time.   Continue with depakote 250 mg qam and 500mg  qpm   check level   - Valproic Acid; Future  - Valproic Acid    3. Mixed hyperlipidemia     Stable.   Continue with simvastatin 20 mg po qhs.   Check lab today.   - Lipid Panel; Future  - Lipid Panel    4. Idiopathic gout of right wrist, unspecified chronicity  Stable   Continue with allopurinol 300mg  po qd    - Uric Acid; Future  - Uric Acid    5. Prediabetes  Stable.   Life style modification.   Check lab today.     - Hgb A1c; Future  - Hgb A1c    6. Hx of colonic polyp  Sp colonoscopy this year per patient.   Will need record.   He will let me know the GI physician.       7. Irritable bowel syndrome, unspecified type  Stable.   Continue with metameucil per Dr. Michael Litter    8. BMI 31.0-31.9,adult  Improved. Lost 10 lbs since last visit.     counseling the patient on weight loss techniques, the risks of obesity such as diabetes, hypertension, cardiovascular disease, hyperlipidemia, and their associated complications.  Also counseled patient on the benefits of weight loss. Patient understands the risks as discussed above.       9. Skin lesion  Growing and irritating near the left shoulder blade.     - Referral to Dermatology    10. Need for influenza vaccination     - Influenza vaccine IM;   PF; Future  - Influenza vaccine IM;   PF    11. Alcohol use  Audit = 6   counseling the patient on cutting down alcohol intake, the risks of alcohol misuse such as liver dysfunction, cirrhosis, cardiovascular disease, hypertension, death.   Patient also inform to drink responsibly  No drinking and driving.   Recommend patient to avoid due to his seizure.  12. Left hand pain  OA vs tendonitis vs others.   Recommend nsaids or tylenol prn   Avoid aggravating actvities.   Wrist brace Consider OT or imaging if no improvement.                RTC:  Return in about 1 year (around 12/26/2019) for lab 1 week before office visit, annual physical.    Today's visit lasted more than 30 minutes of which greater than 50% was spent in counseling, coordination of care and a detailed question and answer session regarding the pathophysiology, etiology, and treatment options available including the risks and benefits related to the medical issues pertinent to this encounter.   The above plan of care, diagnosis, orders, and follow-up were discussed with the patient.  Questions related to this recommended plan of care were answered.                Kreston Ahrendt S. Regino Schultze, MD 12/26/2018 9:50 AM  Assistant Clinical Professor  Internal Medicine  [facility]  [address], [address]  [place], [place] [address]  Office:[phone]

## 2018-12-26 ENCOUNTER — Ambulatory Visit: Payer: BLUE CROSS/BLUE SHIELD

## 2018-12-26 DIAGNOSIS — L989 Disorder of the skin and subcutaneous tissue, unspecified: Secondary | ICD-10-CM

## 2018-12-26 DIAGNOSIS — Z7289 Other problems related to lifestyle: Secondary | ICD-10-CM

## 2018-12-26 DIAGNOSIS — M79642 Pain in left hand: Secondary | ICD-10-CM

## 2018-12-26 DIAGNOSIS — Z23 Encounter for immunization: Secondary | ICD-10-CM

## 2018-12-26 NOTE — Patient Instructions
1. Please let me know about your colonoscopy report and who was the GI physician you saw.    Thanks.     2. For dermatology, you can call:     [facility] offices     [address]    [place], [place] [address]   [phone]   Dr. Corey Skains Pascucci

## 2018-12-27 ENCOUNTER — Ambulatory Visit: Payer: BLUE CROSS/BLUE SHIELD

## 2018-12-27 DIAGNOSIS — Z1389 Encounter for screening for other disorder: Secondary | ICD-10-CM

## 2018-12-27 DIAGNOSIS — D7589 Other specified diseases of blood and blood-forming organs: Secondary | ICD-10-CM

## 2018-12-27 LAB — Lipid Panel: TRIGLYCERIDES: 105 mg/dL (ref <150)

## 2018-12-27 LAB — Albumin/Creatinine Ratio,Urine: ALBUMIN/CREATININE RATIO: 11.1 ug/mg (ref <30.0)

## 2018-12-27 LAB — Comprehensive Metabolic Panel
BILIRUBIN,TOTAL: 0.6 mg/dL (ref 0.1–1.2)
CREATININE: 0.97 mg/dL (ref 0.60–1.30)

## 2018-12-27 LAB — CBC: WHITE BLOOD CELL COUNT: 6.3 10*3/uL (ref 4.16–9.95)

## 2018-12-27 LAB — Hgb A1c: HGB A1C - HPLC: 6 — ABNORMAL HIGH (ref <5.7)

## 2018-12-27 LAB — Uric Acid: URIC ACID: 5 mg/dL (ref 3.4–8.8)

## 2018-12-27 LAB — Differential Automated: ABSOLUTE IMMATURE GRAN COUNT: 0.01 10*3/uL (ref 0.00–0.04)

## 2018-12-27 LAB — UA,Microscopic: WBCS HPF: 1 {cells}/[HPF] (ref 0–4)

## 2018-12-27 LAB — Valproic Acid: VALPROIC ACID: 37 ug/mL — ABNORMAL LOW (ref 50–100)

## 2018-12-27 LAB — UA,Dipstick: KETONES: NEGATIVE (ref 5.0–8.0)

## 2018-12-28 LAB — Folate,Serum: FOLATE,SERUM: >40 ng/mL — ABNORMAL HIGH (ref 8.1–30.4)

## 2018-12-28 LAB — Vitamin B12: VITAMIN B12: 548 pg/mL (ref 254–1060)

## 2018-12-28 LAB — PSA,Screening: PSA,SCREENING: 1.3 ng/mL (ref 0–4.5)

## 2018-12-28 LAB — TSH with reflex FT4, FT3: TSH: 1.9 u[IU]/mL (ref 0.3–4.7)

## 2018-12-29 NOTE — Telephone Encounter
Records requested

## 2018-12-30 ENCOUNTER — Ambulatory Visit: Payer: BLUE CROSS/BLUE SHIELD

## 2019-01-02 ENCOUNTER — Ambulatory Visit: Payer: BLUE CROSS/BLUE SHIELD

## 2019-01-02 DIAGNOSIS — L821 Other seborrheic keratosis: Secondary | ICD-10-CM

## 2019-01-02 DIAGNOSIS — L814 Other melanin hyperpigmentation: Secondary | ICD-10-CM

## 2019-01-02 DIAGNOSIS — Z85828 Personal history of other malignant neoplasm of skin: Secondary | ICD-10-CM

## 2019-01-02 DIAGNOSIS — D229 Melanocytic nevi, unspecified: Secondary | ICD-10-CM

## 2019-01-02 NOTE — Progress Notes
New Patient Exam  ?  Chief complaint: new lesion   ???  History of present illness:  Joseph Donovan, a 63 y.o. male referred for:    1. New lesion. Location: left shoulder.  Duration: months. Associated symptoms: rough. Prior treatment: none.     2. History of skin cancer on the back x 2, most recently about two years ago. Thinks was non-melanoma skin cancer, not sure what type, treated by outside office.     Patient denies any other new or changing lesions of concern.    No history of atypical nevi    ?PMH/Meds/All/Soc/Fam hx: Reviewed in [facility]    Personal Hx skin cancer:  Negative for melanoma and positive for non-melanoma skin cancer  Fam Hx skin cancer:  Negative for melanoma and negative for non-melanoma skin cancer     No new changes to PMH/Meds/All/Soc/Fam hx - please refer to today's intake form.  Patient Active Problem List   Diagnosis   ??? H/O colonoscopy   ??? Epilepsy   ??? Gout of wrist   ??? Hyperlipidemia   ??? Irritable colon   ??? Low vitamin B12 level   ??? Macrocytosis without anemia   ??? Snores   ??? Seizure (HCC/RAF)   ??? Cyst of kidney, acquired   ??? BMI 31.0-31.9,adult     Outpatient Medications Prior to Visit   Medication Sig   ??? ALLOPURINOL 300 mg tablet TAKE 1 TABLET DAILY   ??? B Complex Vitamins (VITAMIN B COMPLEX) capsule Take 1 capsule by mouth daily.   ??? CALCIUM-VITAMIN D PO Take by mouth.   ??? cholecalciferol 10 mcg (400 units) tablet Take 400 Units by mouth daily.   ??? cyanocobalamin 1000 mcg tablet Take 1,000 mcg by mouth.   ??? divalproex 250 mg 24 hr tablet TAKE 1 TABLET EVERY MORNINGAND 2 TABLETS EVERY EVENING.   ??? ferrous sulfate 325 (65 FE) mg EC tablet Take 325 mg by mouth three (3) times daily with meals.   ??? fish oil 1000 mg capsule Take by mouth.   ??? hyoscyamine 0.125 mg tablet Take 0.125 mg by mouth.   ??? ketoconazole 2% cream    ??? multivitamin tablet Take by mouth daily.   ??? SIMVASTATIN 20 mg tablet TAKE 1 TABLET NIGHTLY     No facility-administered medications prior to visit. No Known Allergies  ???  Review of systems:   ?Constitutional: Negative  ENMT: Not assessed   Endo: Not assessed   Musculoskelatal: Not assessed   Skin otherwise Negative  Conjunctiva and Lids: Negative    Objective:   General appearance: well-developed/well nourished for stated age, no apparent distress.  Mental status: alert and oriented.   Mood: cooperative, pleasant.  Affect: normal  A complete examination of skin was performed. This includes examination of the skin of the face, conjunctiva and eyelids, ears, lips and gums, scalp, hair, neck, chest, breast, axillae, abdomen, back, left and right upper and lower extremities, inguinal folds, hands and feet, toenails and fingernails, oropharyngeal mucosa, groin and buttocks. The positive findings are listed below.  The remainder of the exam was unremarkable for suspicious lesions.   ?  -Two linear scars on the back, no recurrence  -Well demarcated tan to brown waxy stuck-on hyperkeratotic papules on the face, trunk, and extremities-including lesion of patient's concern on the left shoulder  -Tan to brown uniform macules and papules with benign pigment network on dermoscopy on the face, trunk, and extremities   -Multiple scattered tan-brown macules on the  sun-exposed areas of the face, trunk and extremities    Assessment/Plan:    1. Seborrheic keratosis  --The benign nature of the diagnosis was discussed.  Reassurance was given.  They were asked to return if any changes develop.     2. Multiple benign nevi  --The benign nature of the diagnosis was discussed.  Reassurance was given.  They were asked to return if any changes develop.     3. Lentigines  --The benign nature of the diagnosis was discussed.  Reassurance was given.  They were asked to return if any changes develop.     4. History of skin cancer  --No evidence of local recurrence    --ABCDE's of moles/melanoma reviewed in detail with the patient. Monthly self examination was encouraged with the instructions to follow up should any lesions show evidence of growth, color change or become symptomatic. Daily sun precautions were reviewed including use of moisturizer with sunscreen daily, sun avoidance, and protective clothing.       Sun protection strategies and skin cancer risk factors reviewed.   ?  Follow-up in one year for skin check or earlier as needed     Kelly Splinter, M.D.  Dermatology

## 2019-01-05 NOTE — Telephone Encounter
Records received, scanned

## 2019-03-17 ENCOUNTER — Telehealth: Payer: BLUE CROSS/BLUE SHIELD

## 2019-03-17 DIAGNOSIS — Z20828 Contact with and (suspected) exposure to other viral communicable diseases: Secondary | ICD-10-CM

## 2019-03-17 NOTE — Progress Notes
RETURN PATIENT TELEMEDICINE VISIT--no charge placed on this visit but will be placed on in-person encounter tomorrow.   Provider has determined an electronic visit is appropriate and safe due to the COVID19 crisis    PATIENT:  Joseph Donovan  MRN:  [id number]  DOB:  [birth year]  DATE OF SERVICE:  03/17/2019    PRIMARY CARE PROVIDER: Garret Reddish., MD    Patient Consent to Telehealth Questionnaire   [facility] TELEHEALTH PRECHECKIN QUESTIONS 03/17/2019   By clicking ''I Agree'', I consent to the below:  I Agree     - I agree  to be treated via a video visit and acknowledge that I may be liable for any relevant copays or coinsurance depending on my insurance plan.  - I understand that this video visit is offered for my convenience and I am able to cancel and reschedule for an in-person appointment if I desire.  - I also acknowledge that sensitive medical information may be discussed during this video visit appointment and that it is my responsibility to locate myself in a location that ensures privacy to my own level of comfort.  - I also acknowledge that I should not be participating in a video visit in a way that could cause danger to myself or to those around me (such as driving or walking).  If my provider is concerned about my safety, I understand that they have the right to terminate the visit.   Location of provider: [address]  Location of patient: home    Chief complaint: cough, [phone] testing request  Subjective:     Joseph Donovan is a 63 y.o. male with concern for covid19.  Daughter exposed to [phone] and result was positive. 10 days ago she was tested. Daughter was asymptomatic and still asymptomatic for 10 days now.  No symptoms for pt.   Pt has chronic cough that is unchanged. Denies fevers, chills, headache, sore throat, cough, loss of sense of smell/taste, sob, chest pain, abdominal pain, diarrhea, myalgia, fatigue.   Last contact w/ daughter was talk yesterday in evening in living room. No mask on. Share common space(living room, kitchen) when daughter does come to live at his place.  Wife is asymptomatic.    History:     Patient Active Problem List   Diagnosis   ? H/O colonoscopy   ? Epilepsy   ? Gout of wrist   ? Hyperlipidemia   ? Irritable colon   ? Low vitamin B12 level   ? Macrocytosis without anemia   ? Snores   ? Seizure (HCC/RAF)   ? Cyst of kidney, acquired   ? BMI 31.0-31.9,adult     Past Medical History:   Diagnosis Date   ? Epilepsy    ? Gout    ? Hx of colonic polyp    ? Hyperlipidemia    ? IBS (irritable bowel syndrome)     diarrhea   ? Prediabetes      Current Outpatient Medications   Medication Sig   ? ALLOPURINOL 300 mg tablet TAKE 1 TABLET DAILY   ? B Complex Vitamins (VITAMIN B COMPLEX) capsule Take 1 capsule by mouth daily.   ? CALCIUM-VITAMIN D PO Take by mouth.   ? cholecalciferol 10 mcg (400 units) tablet Take 400 Units by mouth daily.   ? cyanocobalamin 1000 mcg tablet Take 1,000 mcg by mouth.   ? divalproex 250 mg 24 hr tablet TAKE 1 TABLET EVERY MORNINGAND 2 TABLETS EVERY EVENING.   ?  ferrous sulfate 325 (65 FE) mg EC tablet Take 325 mg by mouth three (3) times daily with meals.   ? fish oil 1000 mg capsule Take by mouth.   ? hyoscyamine 0.125 mg tablet Take 0.125 mg by mouth.   ? ketoconazole 2% cream    ? multivitamin tablet Take by mouth daily.   ? SIMVASTATIN 20 mg tablet TAKE 1 TABLET NIGHTLY     No current facility-administered medications for this visit.      Social History     Socioeconomic History   ? Marital status: Married     Spouse name: Not on file   ? Number of children: Not on file   ? Years of education: Not on file   ? Highest education level: Not on file   Occupational History   ? Not on file   Social Needs   ? Financial resource strain: Not on file   ? Food insecurity     Worry: Not on file     Inability: Not on file   ? Transportation needs     Medical: Not on file     Non-medical: Not on file   Tobacco Use   ? Smoking status: Former Smoker     Packs/day: 0.50 Years: 40.00     Pack years: 20.00     Types: Cigarettes     Quit date: 03/27/2007     Years since quitting: 11.9   ? Smokeless tobacco: Never Used   Substance and Sexual Activity   ? Alcohol use: Yes     Comment: socially   ? Drug use: Not Currently   ? Sexual activity: Yes     Partners: Female   Lifestyle   ? Physical activity     Days per week: Not on file     Minutes per session: Not on file   ? Stress: Not on file   Relationships   ? Social Wellsite geologist on phone: Not on file     Gets together: Not on file     Attends religious service: Not on file     Active member of club or organization: Not on file     Attends meetings of clubs or organizations: Not on file     Relationship status: Not on file   Other Topics Concern   ? Not on file   Social History Narrative    Married with step daughter.         Born in [place] to [place]    Since then in [place]        Work as Emergency planning/management officer in [employer].     Does lots of international travel before.     Has been working from home.      Family History   Problem Relation Age of Onset   ? Depression Mother    ? Meniere's disease Mother    ? Brain cancer Father 61       Review of Systems   Per HPI  Objective:   Physical Exam:       Wt Readings from Last 3 Encounters:   12/26/18 (!) 231 lb (104.8 kg)   03/11/18 (!) 242 lb 12.8 oz (110.1 kg)     There is no height or weight on file to calculate BMI.  System Check if normal Positive or additional negative findings   Constit  [x]  General appearance    Eyes  [x]   Conj/Lids []  Pupils  []  Fundi     HENMT  []  External ears/nose []  Otoscopy   []  gross Hearing []  Nasal mucosa   []  Lips/teeth/gums []  Oropharynx    []  mucus membranes     Neck  []  Inspection/palpation []  Thyroid     Resp  [x]  Effort []  Wheezing    []  Auscultation  [] Crackles speaking in full sentences without issue   CV  []  Rhythm/rate   []  Murmurs   []  LEE   []  JVP non-elevated    Normal pulses:   []  radial []  Femoral  []  Pedal Breast  []  Inspection []  Palpation     GI  []  abd masses    []  tenderness   []  rebound/guarding   []  Liver/spleen []  Rectal     GU  M: []  Scrotum []  Penis []  Prostate   F:  []  External []  vaginal wall        []  Cervix  []  mucus        []  Uterus    []  Adnexa      Lymph  []  Neck []  Axillae []  Groin     MSK Specify site examined:    []  Inspect/palp []  ROM   []  Stability []  Strength/tone         Skin  []  Inspection []  Palpation     Neuro  []  CN2-12 intact grossly   [x]  Alert   []  DTR      []  Muscle strength      []  Sensation   []  Gait/balance     Psych  [x]  Insight/judgement     [x]  Mood/affect    [x]  Gross cognition        Labs/Studies:        Assessment & Plan:   KAROL LIENDO is a 63 y.o. male with concern for covid19.    #suspect [phone] infection  Daughter exposed to [phone] contact about 10 days ago and was tested. Daughter was asymptomatic and still asymptomatic for 10 days now.  No symptoms for pt but has chronic cough that is unchanged.  Last contact w/ daughter was talk yesterday in evening in living room. No mask on.  Wife is asymptomatic.  -discussed w/ pt that testing positive will not result in any specific medication given. It is just to know of diagnosis and medical management doesn't change much.   Pt still wanted to get tested.  -covid19 PCR to be done in clinic tomorrow   -cont social distancing/general hygiene measures  -discussed ER precautions    Future Appointments   Date Time Provider Department Center   03/17/2019 12:00 PM Anette Barra, Isaac Laud., MD [facility] MEDICINE   12/22/2019  8:20 AM LAB SCHEDULE [facility] MEDICINE   12/29/2019  8:00 AM Garret Reddish., MD [facility] MEDICINE   01/04/2020  8:00 AM Pascucci, Corey Skains, MD [facility] MEDICINE     Author:  Linton Ham, MD 03/17/2019 9:29 AM    The above plan of care, diagnosis, order, and follow-up were discussed with the patient. Questions related to this recommended plan of care were answered

## 2019-03-18 ENCOUNTER — Ambulatory Visit: Payer: BLUE CROSS/BLUE SHIELD

## 2019-03-18 DIAGNOSIS — Z20828 Contact with and (suspected) exposure to other viral communicable diseases: Secondary | ICD-10-CM

## 2019-03-18 NOTE — Progress Notes
Primary care progress note    PATIENT: Joseph Donovan  MRN: [id number]  DOB: 29-Aug-1955  DATE OF SERVICE: 03/18/2019  PRIMARY CARE PHYSICIAN: Garret Reddish., MD      Chief complaint:covid19 test  Subjective:     Joseph Donovan is a 63 y.o. male with concern for covid19 and presenting today for in-clinic covid testing. Seen by me yesterday on video visit.    Daughter exposed to [phone] and result was positive. 10 days ago she was tested. Daughter was asymptomatic and still asymptomatic for 10 days now.  No symptoms for pt.   Pt has chronic cough that is unchanged. Denies fevers, chills, headache, sore throat, cough, loss of sense of smell/taste, sob, chest pain, abdominal pain, diarrhea, myalgia, fatigue.   Last contact w/ daughter was talk yesterday in evening in living room. No mask on.   Share common space(living room, kitchen) when daughter does come to live at his place.  Wife is asymptomatic.    History:     Patient Active Problem List   Diagnosis   ? H/O colonoscopy   ? Epilepsy   ? Gout of wrist   ? Hyperlipidemia   ? Irritable colon   ? Low vitamin B12 level   ? Macrocytosis without anemia   ? Snores   ? Seizure (HCC/RAF)   ? Cyst of kidney, acquired   ? BMI 31.0-31.9,adult     Past Medical History:   Diagnosis Date   ? Epilepsy    ? Gout    ? Hx of colonic polyp    ? Hyperlipidemia    ? IBS (irritable bowel syndrome)     diarrhea   ? Prediabetes      Current Outpatient Medications   Medication Sig   ? ALLOPURINOL 300 mg tablet TAKE 1 TABLET DAILY   ? B Complex Vitamins (VITAMIN B COMPLEX) capsule Take 1 capsule by mouth daily.   ? CALCIUM-VITAMIN D PO Take by mouth.   ? cholecalciferol 10 mcg (400 units) tablet Take 400 Units by mouth daily.   ? cyanocobalamin 1000 mcg tablet Take 1,000 mcg by mouth.   ? divalproex 250 mg 24 hr tablet TAKE 1 TABLET EVERY MORNINGAND 2 TABLETS EVERY EVENING.   ? ferrous sulfate 325 (65 FE) mg EC tablet Take 325 mg by mouth three (3) times daily with meals. ? fish oil 1000 mg capsule Take by mouth.   ? hyoscyamine 0.125 mg tablet Take 0.125 mg by mouth.   ? ketoconazole 2% cream    ? multivitamin tablet Take by mouth daily.   ? SIMVASTATIN 20 mg tablet TAKE 1 TABLET NIGHTLY     No current facility-administered medications for this visit.      Social History     Socioeconomic History   ? Marital status: Married     Spouse name: Not on file   ? Number of children: Not on file   ? Years of education: Not on file   ? Highest education level: Not on file   Occupational History   ? Not on file   Social Needs   ? Financial resource strain: Not on file   ? Food insecurity     Worry: Not on file     Inability: Not on file   ? Transportation needs     Medical: Not on file     Non-medical: Not on file   Tobacco Use   ? Smoking status: Former Smoker  Packs/day: 0.50     Years: 40.00     Pack years: 20.00     Types: Cigarettes     Quit date: 03/27/2007     Years since quitting: 11.9   ? Smokeless tobacco: Never Used   Substance and Sexual Activity   ? Alcohol use: Yes     Comment: socially   ? Drug use: Not Currently   ? Sexual activity: Yes     Partners: Female   Lifestyle   ? Physical activity     Days per week: Not on file     Minutes per session: Not on file   ? Stress: Not on file   Relationships   ? Social Wellsite geologist on phone: Not on file     Gets together: Not on file     Attends religious service: Not on file     Active member of club or organization: Not on file     Attends meetings of clubs or organizations: Not on file     Relationship status: Not on file   Other Topics Concern   ? Not on file   Social History Narrative    Married with step daughter.         Born in [place] to [place]    Since then in [place]        Work as Emergency planning/management officer in [employer].     Does lots of international travel before.     Has been working from home.      Family History   Problem Relation Age of Onset   ? Depression Mother    ? Meniere's disease Mother ? Brain cancer Father 52       Review of Systems   Per HPI  Objective:   Physical Exam:       Wt Readings from Last 3 Encounters:   12/26/18 (!) 231 lb (104.8 kg)   03/11/18 (!) 242 lb 12.8 oz (110.1 kg)     There is no height or weight on file to calculate BMI.  System Check if normal Positive or additional negative findings   Constit  [x]  General appearance    Eyes  [x]  Conj/Lids []  Pupils  []  Fundi     HENMT  []  External ears/nose []  Otoscopy   []  gross Hearing []  Nasal mucosa   []  Lips/teeth/gums []  Oropharynx    []  mucus membranes     Neck  []  Inspection/palpation []  Thyroid     Resp  [x]  Effort []  Wheezing    []  Auscultation  [] Crackles speaking in full sentences without issue   CV  []  Rhythm/rate   []  Murmurs   []  LEE   []  JVP non-elevated    Normal pulses:   []  radial []  Femoral  []  Pedal     Breast  []  Inspection []  Palpation     GI  []  abd masses    []  tenderness   []  rebound/guarding   []  Liver/spleen []  Rectal     GU  M: []  Scrotum []  Penis []  Prostate   F:  []  External []  vaginal wall        []  Cervix  []  mucus        []  Uterus    []  Adnexa      Lymph  []  Neck []  Axillae []  Groin     MSK Specify site examined:    []  Inspect/palp []  ROM   []   Stability []  Strength/tone         Skin  []  Inspection []  Palpation     Neuro  []  CN2-12 intact grossly   [x]  Alert   []  DTR      []  Muscle strength      []  Sensation   []  Gait/balance     Psych  [x]  Insight/judgement     [x]  Mood/affect    [x]  Gross cognition        Labs/Studies:        Assessment & Plan:   Joseph Donovan is a 63 y.o. male with concern for covid19.    #suspect [phone] infection  Daughter exposed to [phone] contact about 10 days ago and was tested. Daughter was asymptomatic and still asymptomatic for 10 days now.  No symptoms for pt but has chronic cough that is unchanged.  Last contact w/ daughter was talk yesterday in evening in living room. No mask on.  Wife is asymptomatic. -discussed w/ pt that testing positive will not result in any specific medication given. It is just to know of diagnosis and medical management doesn't change much.   Pt still wanted to get tested.  -WNUUV25 PCR done in clinic  -cont social distancing/general hygiene measures  -discussed ER precautions    Future Appointments   Date Time Provider Department Center   03/18/2019 12:15 PM Linton Ham., MD [facility] MEDICINE   12/22/2019  8:20 AM LAB SCHEDULE [facility] MEDICINE   12/29/2019  8:00 AM Garret Reddish., MD [facility] MEDICINE   01/04/2020  8:00 AM Pascucci, Corey Skains, MD [facility] MEDICINE     Author:  Linton Ham, MD 03/18/2019 7:39 AM    The above plan of care, diagnosis, order, and follow-up were discussed with the patient. Questions related to this recommended plan of care were answered

## 2019-03-20 LAB — COVID-19 and Influenza A/B PCR: INFLUENZA B PCR: NOT DETECTED

## 2019-06-25 ENCOUNTER — Ambulatory Visit: Payer: BLUE CROSS/BLUE SHIELD

## 2019-06-25 DIAGNOSIS — Z23 Encounter for immunization: Secondary | ICD-10-CM

## 2019-12-14 DIAGNOSIS — G4733 Obstructive sleep apnea (adult) (pediatric): Secondary | ICD-10-CM

## 2019-12-22 ENCOUNTER — Ambulatory Visit: Payer: BLUE CROSS/BLUE SHIELD

## 2019-12-29 ENCOUNTER — Ambulatory Visit: Payer: BLUE CROSS/BLUE SHIELD

## 2020-01-04 ENCOUNTER — Ambulatory Visit: Payer: BLUE CROSS/BLUE SHIELD

## 2020-04-13 MED ORDER — ALLOPURINOL 300 MG PO TABS
ORAL_TABLET | 1 refills
Start: 2020-04-13 — End: ?

## 2020-04-13 MED ORDER — DIVALPROEX SODIUM ER 250 MG PO TB24
ORAL_TABLET | 1 refills | 30.00000 days
Start: 2020-04-13 — End: ?

## 2020-04-13 MED ORDER — SIMVASTATIN 20 MG PO TABS
ORAL_TABLET | 1 refills
Start: 2020-04-13 — End: ?

## 2020-04-14 MED ORDER — SIMVASTATIN 20 MG PO TABS
20 mg | ORAL_TABLET | Freq: Every day | ORAL | 0 refills
Start: 2020-04-14 — End: ?

## 2020-04-14 MED ORDER — ALLOPURINOL 300 MG PO TABS
300 mg | ORAL_TABLET | Freq: Every day | ORAL | 0 refills
Start: 2020-04-14 — End: ?

## 2020-04-14 MED ORDER — DIVALPROEX SODIUM ER 250 MG PO TB24
ORAL_TABLET | 0 refills
Start: 2020-04-14 — End: ?

## 2020-04-20 ENCOUNTER — Ambulatory Visit: Payer: BLUE CROSS/BLUE SHIELD

## 2020-04-20 MED ORDER — DIVALPROEX SODIUM ER 250 MG PO TB24
ORAL_TABLET | 0 refills | Status: AC
Start: 2020-04-20 — End: 2020-04-23

## 2020-04-20 MED ORDER — ALLOPURINOL 300 MG PO TABS
300 mg | ORAL_TABLET | Freq: Every day | ORAL | 0 refills | Status: AC
Start: 2020-04-20 — End: ?

## 2020-04-20 MED ORDER — SIMVASTATIN 20 MG PO TABS
20 mg | ORAL_TABLET | Freq: Every evening | ORAL | 0 refills | Status: AC
Start: 2020-04-20 — End: ?

## 2020-04-20 NOTE — Telephone Encounter
Rx Request Deferred to MD    Hi Dr Mina Marble,    Refill requested via MyChart for divalproex, simvastatin and allopurinol.  Last office visit with you was over 1 year ago - see MyChart message from patient.  Next scheduled appointment with you: 07/15/2020    Deferring to you per protocol for review.    Thanks,    Elveria Royals, PharmD, BCPS

## 2020-04-22 ENCOUNTER — Ambulatory Visit: Payer: BLUE CROSS/BLUE SHIELD

## 2020-04-22 MED ORDER — DIVALPROEX SODIUM ER 250 MG PO TB24
0 refills
Start: 2020-04-22 — End: ?

## 2020-04-23 MED ORDER — DIVALPROEX SODIUM ER 250 MG PO TB24
ORAL_TABLET | 0 refills | Status: AC
Start: 2020-04-23 — End: 2020-04-26

## 2020-04-24 ENCOUNTER — Ambulatory Visit: Payer: BLUE CROSS/BLUE SHIELD

## 2020-04-24 DIAGNOSIS — M10031 Idiopathic gout, right wrist: Secondary | ICD-10-CM

## 2020-04-24 DIAGNOSIS — Z0000 Encounter for general adult medical examination without abnormal findings: Secondary | ICD-10-CM

## 2020-04-25 MED ORDER — DIVALPROEX SODIUM ER 250 MG PO TB24
0 refills | 30.00000 days
Start: 2020-04-25 — End: ?

## 2020-04-26 ENCOUNTER — Telehealth: Payer: BLUE CROSS/BLUE SHIELD

## 2020-04-26 MED ORDER — DIVALPROEX SODIUM ER 250 MG PO TB24
0 refills
Start: 2020-04-26 — End: ?

## 2020-04-26 MED ORDER — DIVALPROEX SODIUM ER 500 MG PO TB24
500 mg | ORAL_TABLET | Freq: Every day | ORAL | 3 refills
Start: 2020-04-26 — End: ?

## 2020-04-26 MED ORDER — DIVALPROEX SODIUM ER 250 MG PO TB24
ORAL_TABLET | 0 refills | 30.00000 days
Start: 2020-04-26 — End: ?

## 2020-04-26 MED ORDER — DIVALPROEX SODIUM ER 250 MG PO TB24
ORAL_TABLET | 0 refills | Status: AC
Start: 2020-04-26 — End: 2020-04-29

## 2020-04-27 NOTE — Telephone Encounter
Medication Prior Authorization    Name of medication: DIVALPROEX ER 250 MG TABLET     Per Pharmacy, previous prior auth for this medication did not go through and needs to be resubmitted correctly    Prescribed date: 04/25/20    Is there an alternative covered medication? No    Has patient taken any other medications for this matter? No    Insurance company phone number:   [place] fax number:   Pharmacy does not have fax number     Patient or caller has been notified of the 24-48 hour turnaround time.

## 2020-04-28 MED ORDER — DIVALPROEX SODIUM ER 500 MG PO TB24
500 mg | ORAL_TABLET | Freq: Every evening | ORAL | 3 refills | Status: AC
Start: 2020-04-28 — End: ?

## 2020-04-28 MED ORDER — DIVALPROEX SODIUM ER 250 MG PO TB24
ORAL_TABLET | 1 refills | Status: AC
Start: 2020-04-28 — End: ?

## 2020-04-28 NOTE — Telephone Encounter
Yes, the pharmacy recommend to break the dose up into two Rx due to medication not going through insurance on their end although I received an approved PA.

## 2020-04-28 NOTE — Telephone Encounter
Meds prescribed.   Please inform patient of the changes in his medications.   Thanks.

## 2020-04-30 NOTE — Telephone Encounter
Pt informed, confirmed understanding.

## 2020-05-24 ENCOUNTER — Ambulatory Visit: Payer: BLUE CROSS/BLUE SHIELD

## 2020-06-06 ENCOUNTER — Ambulatory Visit: Payer: BLUE CROSS/BLUE SHIELD

## 2020-06-06 DIAGNOSIS — R053 Chronic cough: Secondary | ICD-10-CM

## 2020-06-06 MED ORDER — FLOVENT HFA 44 MCG/ACT IN AERO
1 | Freq: Two times a day (BID) | RESPIRATORY_TRACT | 0 refills | Status: AC
Start: 2020-06-06 — End: ?

## 2020-06-06 NOTE — Patient Instructions
flonase two sprays each nostril daily x 2weeks. Can continue aller-tec daily.  This is for potential post-nasal drip/upper airway cough syndrome.    I will prescribe flovent to try incase there is a recurrence of your asthma causing a cough/airway reactivity.

## 2020-06-06 NOTE — Progress Notes
Primary care progress note    PATIENT: Joseph Donovan  MRN: [id number]  DOB: 1955-05-28  DATE OF SERVICE: 06/06/2020  PRIMARY CARE PHYSICIAN: Garret Reddish., MD    Subjective:   CC:   AVW:UJWJ is a 66 y.o. male w/ hx of epilepsy, gout, HLD, irritable colon, colon polyp, cyst of kidney(acquired) who presents for cough/chest discomfort.    03/2019 cough and not getting better.  Feels more phlegmy. Worse in morning. Better in afternoon.  No fevers/chills.  Feels washed out, under weather.  In jan some runny nose.   Has some nasal allergies to pollen.  No new pets at home.  Been living long time at current home.  Not sure of acid reflux. Not worse after acid reflux.  No coughing while eating.   Had childhood asthma.   No sob.  Teenager had inhaler use.  Occasional wheezing.    Sternum chest discomfort. Hard to describe it. If camping and inhale wood smoke--rough feeling is what he would describe it.  Discomfort recent.  Persistent.   Not really brought on by exertion.         Past Medical History:   Diagnosis Date   ??? Epilepsy (HCC/RAF)    ??? Gout    ??? Hx of colonic polyp    ??? Hyperlipidemia    ??? IBS (irritable bowel syndrome)     diarrhea   ??? Prediabetes      Past Surgical History:   Procedure Laterality Date   ??? TONSILLECTOMY       Family History   Problem Relation Age of Onset   ??? Depression Mother    ??? Meniere's disease Mother    ??? Brain cancer Father 77     Social History     Socioeconomic History   ??? Marital status: Married     Spouse name: Not on file   ??? Number of children: Not on file   ??? Years of education: Not on file   ??? Highest education level: Not on file   Occupational History   ??? Not on file   Tobacco Use   ??? Smoking status: Former Smoker     Packs/day: 0.50     Years: 40.00     Pack years: 20.00     Types: Cigarettes     Quit date: 03/27/2007     Years since quitting: 13.2   ??? Smokeless tobacco: Never Used   Substance and Sexual Activity   ??? Alcohol use: Yes     Comment: socially   ??? Drug use: Not Currently   ??? Sexual activity: Yes     Partners: Female   Other Topics Concern   ??? Not on file   Social History Narrative    Married with step daughter.         Born in [place] to [place]    Since then in [place]        Work as Emergency planning/management officer in [employer].     Does lots of international travel before.     Has been working from home.      Social Determinants of Health     Financial Resource Strain: Not on file   Physical Activity: Not on file   Stress: Not on file       Current Outpatient Medications:   ???  allopurinol 300 mg tablet, Take 1 tablet (300 mg total) by mouth daily., Disp: 90 tablet, Rfl: 0  ???  B Complex Vitamins (VITAMIN B COMPLEX) capsule, Take 1 capsule by mouth daily., Disp: , Rfl:   ???  CALCIUM-VITAMIN D PO, Take by mouth., Disp: , Rfl:   ???  cholecalciferol 10 mcg (400 units) tablet, Take 400 Units by mouth daily., Disp: , Rfl:   ???  cyanocobalamin 1000 mcg tablet, Take 1,000 mcg by mouth., Disp: , Rfl:   ???  divalproex 250 mg 24 hr tablet, Take one tablet qam.., Disp: 270 tablet, Rfl: 1  ???  divalproex 500 mg 24 hr tablet, Take 1 tablet (500 mg total) by mouth at bedtime., Disp: 90 tablet, Rfl: 3  ???  ferrous sulfate 325 (65 FE) mg EC tablet, Take 325 mg by mouth three (3) times daily with meals., Disp: , Rfl:   ???  fish oil 1000 mg capsule, Take by mouth., Disp: , Rfl:   ???  hyoscyamine 0.125 mg tablet, Take 0.125 mg by mouth., Disp: , Rfl:   ???  ketoconazole 2% cream, , Disp: , Rfl:   ???  multivitamin tablet, Take by mouth daily., Disp: , Rfl:   ???  simvastatin 20 mg tablet, Take 1 tablet (20 mg total) by mouth at bedtime., Disp: 90 tablet, Rfl: 0  No Known Allergies  Objective:   There were no vitals taken for this visit.  Physical Exam  System Check if normal Positive or additional negative findings   Constit  [x]  General appearance     Eyes  [x]  Conj/Lids []  Pupils  []  Fundi     ENMT  []  External ears/nose []  Otoscopy   [x]  gross Hearing []  Nasal mucosa   []  Lips/teeth/gums []  Oropharynx    []  mucus membranes     Neck  []  Inspection/palpation []  Thyroid     Resp  [x]  Normal effort []  No Wheezing    []  Auscultation  [] No Crackles     CV  []  RRR   []  No  Murmurs   []  JVP non-elevated    Normal pulses:   []  radial []  Femoral  []  Pedal     Breast  []  Inspection []  Palpation     GI  []  No abd masses    []  No tenderness   []  No rebound/guarding   []  Liver/spleen []  Rectal     GU  M: []  Scrotum []  Penis []  Prostate   F:  []  External []  vaginal wall        []  Cervix  []  mucus        []  Uterus    []  Adnexa      Lymph  []  Neck []  Axillae []  Groin     MSK Specify site examined:    []  Inspect/palp []  ROM   []  Stability []  Strength/tone         Skin  []  Inspection []  Palpation     Neuro  [x]  CN2-12 intact grossly   [x]  Alert and oriented   []  Patellar DTR      []  Sensation   [x]  Gait/balance     Psych  [x]  Insight/judgement     [x]  Mood/affect    [x]  Gross cognition          Labs/Imaging:  Lab Results   Component Value Date    WBC 6.30 12/26/2018    HGB 14.3 12/26/2018    HCT 43.9 12/26/2018    MCV 103.5 (H) 12/26/2018    PLT 299 12/26/2018     Lab Results   Component Value Date  NA 138 12/26/2018    K 5.3 12/26/2018    CL 101 12/26/2018    CO2 26 12/26/2018    BUN 16 12/26/2018    CREAT 0.97 12/26/2018           Lab Results   Component Value Date    ALT 16 12/26/2018    AST 17 12/26/2018    ALKPHOS 61 12/26/2018    BILITOT 0.6 12/26/2018     Results for orders placed or performed in visit on 12/26/18   Lipid Panel   Result Value Ref Range    Cholesterol 143 See Comment mg/dL    Cholesterol,LDL,Calc 81 <100 mg/dL    Cholesterol, HDL 41 >40 mg/dL    Triglycerides 161 <096 mg/dL    Non-HDL,Chol,Calc 045 <130 mg/dL     Lab Results   Component Value Date    TSH 1.9 12/26/2018     Lab Results   Component Value Date    HGBA1C 6.0 (H) 12/26/2018     Results for orders placed or performed in visit on 12/26/18   Albumin/Creat Ratio Ur    Specimen: Clean Catch, Midstream; Urine   Result Value Ref Range    Albumin/Creat Ratio 11.1 <30.0 mcg/mg    Albumin, Urine 25.7 No Reference Range mg/L    Creatinine,Random Urine 230.7 No Reference Range mg/dL        Results for orders placed or performed in visit on 12/26/18   Vitamin B12   Result Value Ref Range    Vitamin B12 548 254-1,060 pg/mL     Assessment/plan           The above plan of care, diagnosis, order, and follow-up were discussed with the patient. Questions related to this recommended plan of care were answered.    Future Appointments   Date Time Provider Department Center   06/06/2020  3:45 PM Aubryanna Nesheim, Isaac Laud., MD [facility] MEDICINE   06/09/2020  8:30 AM Garret Reddish., MD [facility] MEDICINE   07/15/2020  4:30 PM Garret Reddish., MD [facility] MEDICINE      Isaac Laud. Naleigha Raimondi 06/06/2020

## 2020-06-09 ENCOUNTER — Ambulatory Visit: Payer: BLUE CROSS/BLUE SHIELD

## 2020-07-05 ENCOUNTER — Ambulatory Visit: Payer: BLUE CROSS/BLUE SHIELD

## 2020-07-07 ENCOUNTER — Ambulatory Visit: Payer: BLUE CROSS/BLUE SHIELD

## 2020-07-07 DIAGNOSIS — R8290 Unspecified abnormal findings in urine: Secondary | ICD-10-CM

## 2020-07-07 DIAGNOSIS — D539 Nutritional anemia, unspecified: Secondary | ICD-10-CM

## 2020-07-15 ENCOUNTER — Ambulatory Visit: Payer: BLUE CROSS/BLUE SHIELD

## 2020-07-15 DIAGNOSIS — E785 Hyperlipidemia, unspecified: Secondary | ICD-10-CM

## 2020-07-15 DIAGNOSIS — D539 Nutritional anemia, unspecified: Secondary | ICD-10-CM

## 2020-07-15 DIAGNOSIS — R7303 Prediabetes: Secondary | ICD-10-CM

## 2020-07-15 DIAGNOSIS — Z712 Person consulting for explanation of examination or test findings: Secondary | ICD-10-CM

## 2020-07-15 DIAGNOSIS — J4520 Mild intermittent asthma, uncomplicated: Secondary | ICD-10-CM

## 2020-07-15 DIAGNOSIS — M10031 Idiopathic gout, right wrist: Secondary | ICD-10-CM

## 2020-07-15 DIAGNOSIS — E669 Obesity, unspecified: Secondary | ICD-10-CM

## 2020-07-15 DIAGNOSIS — Z7289 Other problems related to lifestyle: Secondary | ICD-10-CM

## 2020-07-15 DIAGNOSIS — Z0000 Encounter for general adult medical examination without abnormal findings: Secondary | ICD-10-CM

## 2020-07-15 MED ORDER — SIMVASTATIN 20 MG PO TABS
20 mg | ORAL_TABLET | Freq: Every evening | ORAL | 3 refills | Status: AC
Start: 2020-07-15 — End: ?

## 2020-07-15 MED ORDER — DIVALPROEX SODIUM ER 250 MG PO TB24
ORAL_TABLET | 3 refills | 30.00000 days | Status: AC
Start: 2020-07-15 — End: ?

## 2020-07-15 MED ORDER — DIVALPROEX SODIUM ER 500 MG PO TB24
500 mg | ORAL_TABLET | Freq: Every evening | ORAL | 3 refills | Status: AC
Start: 2020-07-15 — End: ?

## 2020-07-15 MED ORDER — ALBUTEROL SULFATE HFA 108 (90 BASE) MCG/ACT IN AERS
1 | RESPIRATORY_TRACT | 2 refills | Status: AC | PRN
Start: 2020-07-15 — End: ?

## 2020-07-15 MED ORDER — FLUTICASONE-SALMETEROL 45-21 MCG/ACT IN AERO
2 | Freq: Two times a day (BID) | RESPIRATORY_TRACT | 3 refills | Status: AC
Start: 2020-07-15 — End: ?

## 2020-07-15 MED ORDER — ALLOPURINOL 300 MG PO TABS
300 mg | ORAL_TABLET | Freq: Every day | ORAL | 3 refills | Status: AC
Start: 2020-07-15 — End: ?

## 2020-07-15 NOTE — Progress Notes
Patient: Joseph Donovan  MRN: [id number]  Date of Service: 07/15/2020  PCP : Garret Reddish., MD      CC:   Chief Complaint   Patient presents with   ??? Annual Exam     History of Present Illness:     Joseph Donovan is a 65 y.o. male with  has a past medical history of Epilepsy (HCC/RAF), Gout, colonic polyp, Hyperlipidemia, IBS (irritable bowel syndrome), Prediabetes, Seasonal allergies, and Skin cancer. presented   Chief Complaint   Patient presents with   ??? Annual Exam     Last vist 12/2018  Lab done recently      Was given flovent bid 44 mcg bid for 4 weeks   cxr showed bronchial thickening.   Occasional wheezing.   Still has coughing symptoms.   Has not exercise regularly due to covid.   Hx of childhood asthma, was severe before.     No seizure activities.   Still on divalproex 250 mg qam and 500mg  qpm.   Patient interested to see Neurology for possible decrease in his medication.         Preventive Health Assessment:     Exercise is noadequate.    Seatbelts are used at all times.  Wynelle Link protection is used.  Tetanus/ immunization:         Health Maintenance   Topic Date Due   ??? Hepatitis B Screening  Never done   ??? Lung Cancer Screening  Never done   ??? Annual Preventive Wellness Visit  12/26/2019   ??? Tdap/Td Vaccine (3 - Td or Tdap) 10/16/2026   ??? Colorectal Cancer Screening  10/08/2028   ??? Influenza Vaccine  Completed   ??? Hepatitis C Screening  Completed   ??? Shingles (Shingrix) Vaccine  Completed   ??? COVID-19 Vaccine(Tracks primary and booster doses, not sup/immunocomp)  Completed   ??? HIV Screening  Completed   ??? Statin prescribed for ASCVD Prevention or Treatment  Completed            Past Medical History:      Past Medical History:   Diagnosis Date   ??? Epilepsy (HCC/RAF)    ??? Gout    ??? Hx of colonic polyp    ??? Hyperlipidemia    ??? IBS (irritable bowel syndrome)     diarrhea   ??? Prediabetes    ??? Seasonal allergies    ??? Skin cancer        Past Surgical History:      Past Surgical History:   Procedure Laterality Date   ??? TONSILLECTOMY         Allergy:  No Known Allergies     Medication:       Medications that the patient states to be currently taking   Medication Sig   ??? allopurinol 300 mg tablet Take 1 tablet (300 mg total) by mouth daily.   ??? cyanocobalamin 1000 mcg tablet Take 1,000 mcg by mouth.   ??? divalproex 250 mg 24 hr tablet Take one tablet qam..   ??? divalproex 500 mg 24 hr tablet Take 1 tablet (500 mg total) by mouth at bedtime.   ??? fish oil 1000 mg capsule Take by mouth.   ??? multivitamin tablet Take by mouth daily.   ??? simvastatin 20 mg tablet Take 1 tablet (20 mg total) by mouth at bedtime.   ??? [DISCONTINUED] allopurinol 300 mg tablet Take 1 tablet (300 mg total) by mouth daily.   ??? [DISCONTINUED]  divalproex 250 mg 24 hr tablet Take one tablet qam..   ??? [DISCONTINUED] divalproex 500 mg 24 hr tablet Take 1 tablet (500 mg total) by mouth at bedtime.   ??? [DISCONTINUED] fluticasone (FLOVENT HFA) 44 mcg/act inhaler Inhale 1 puff two (2) times daily.   ??? [DISCONTINUED] simvastatin 20 mg tablet Take 1 tablet (20 mg total) by mouth at bedtime.         Family History:   Family History   Problem Relation Age of Onset   ??? Depression Mother    ??? Meniere's disease Mother    ??? Brain cancer Father 54          Social History:    reports that he quit smoking about 13 years ago. His smoking use included cigarettes. He has a 15.00 pack-year smoking history. He has never used smokeless tobacco. He reports current alcohol use of about 2.4 oz of alcohol per week. He reports previous drug use.  Social History     Social History Narrative    Married with step daughter.         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     Has been working from home.             REVIEW OF SYSTEMS   []  10 point ROS negative: pertinent negatives and positives noted in HPI      Review of Systems   Constitutional: Negative.    HENT: Negative.    Eyes: Negative.    Respiratory: Positive for cough.    Cardiovascular: Negative.    Gastrointestinal: Negative.    Genitourinary: Negative.    Musculoskeletal: Negative.    Skin: Negative.    Neurological: Negative.    Endo/Heme/Allergies: Negative.    Psychiatric/Behavioral: Negative.              Physical Exam:     BP 129/72  ~ Pulse 76  ~ Temp 36.7 ???C (98 ???F) (Forehead)  ~ Resp 16  ~ Ht 6' 1'' (1.854 m)  ~ Wt (!) 246 lb 3.2 oz (111.7 kg)  ~ SpO2 96%  ~ BMI 32.48 kg/m???       Physical Exam  Constitutional:       General: He is not in acute distress.     Appearance: He is not diaphoretic.   HENT:      Head: Normocephalic and atraumatic.      Right Ear: Tympanic membrane, ear canal and external ear normal.      Left Ear: Tympanic membrane, ear canal and external ear normal.      Nose: Nose normal.      Right Sinus: No maxillary sinus tenderness or frontal sinus tenderness.      Left Sinus: No maxillary sinus tenderness or frontal sinus tenderness.      Mouth/Throat:      Pharynx: Uvula midline.   Neck:      Trachea: Trachea normal.   Cardiovascular:      Rate and Rhythm: Normal rate and regular rhythm.      Pulses: Normal pulses.      Heart sounds: Normal heart sounds. No murmur heard.  No gallop.    Pulmonary:      Effort: Pulmonary effort is normal.      Breath sounds:  Normal breath sounds. No stridor. No decreased breath sounds, wheezing, rhonchi or rales.   Abdominal:      General: Bowel sounds are normal.      Palpations: Abdomen is soft.      Tenderness: There is no abdominal tenderness.      Hernia: No hernia is present.   Musculoskeletal:      Cervical back: Normal, normal range of motion and neck supple. No spinous process tenderness or muscular tenderness.      Thoracic back: Normal.      Lumbar back: Normal.   Lymphadenopathy:      Cervical: No cervical adenopathy.   Skin:     General: Skin is warm.   Neurological:      Mental Status: He is alert and oriented to person, place, and time. Sensory: Sensation is intact.      Motor: Motor function is intact.      Coordination: Coordination is intact.      Gait: Gait is intact.   Psychiatric:         Mood and Affect: Mood and affect normal.         Speech: Speech normal.         Behavior: Behavior normal.         Thought Content: Thought content normal.         Cognition and Memory: Cognition normal.         Judgment: Judgment normal.          LABS:    Lab Results   Component Value Date    HGBA1C 6.3 (H) 07/06/2020       No results found for: [facility]  Lab Results   Component Value Date    WBC 6.27 07/06/2020    HGB 12.6 (L) 07/06/2020    HCT 39.3 07/06/2020    MCV 103.1 (H) 07/06/2020    PLT 285 07/06/2020     Lab Results   Component Value Date    CREAT 0.97 07/06/2020    BUN 18 07/06/2020    NA 141 07/06/2020    K 4.6 07/06/2020    CL 104 07/06/2020    CO2 28 07/06/2020     Lab Results   Component Value Date    HGBA1C 6.3 (H) 07/06/2020     Lab Results   Component Value Date    ALT 19 07/06/2020    AST 16 07/06/2020    ALKPHOS 64 07/06/2020    BILITOT 0.6 07/06/2020     Lab Results   Component Value Date    TSH 2.3 07/06/2020     Lab Results   Component Value Date    CALCIUM 9.0 07/06/2020     Lab Results   Component Value Date    CHOL 147 07/06/2020    CHOLHDL 47 07/06/2020    CHOLDLCAL 74 07/06/2020    TRIGLY 129 07/06/2020     No results found for: INR, INRPOC, PT    I have   [] reviewed radiology,  [] reviewed labs, [] reviewed diag med test, [] reviewed & summarized old records, [] requested outside medical records.      REVIEW OF DATA   I have   []  Reviewed/ordered []  1 []  2 [x]  ? 3 unique laboratory, radiology, and/or diagnostic tests noted above    []  Reviewed []  1 []  2 []  ? 3 prior external notes and incorporated into patient assessment    []  Discussed management or test interpretation with external provider(s) as noted  Imaging Studies:  reviewed by me          XR chest pa+lat (2 views)    Result Date: 06/07/2020  IMPRESSION:  Diffuse bronchial wall thickening, suggestive of underlying airways disease/inflammation. No evidence of pneumonia. Signed by: Ledon Snare   06/07/2020 7:55 AM         IMPRESSION/PLAN:       Diagnoses and all orders for this visit:    Encounter for annual physical exam  PHQ 9 = 0  AudIt = 3   Colonoscopy = up to date. 2020.   Encourage healthy diet and routine exercise.  Seatbelt, sunscreen and dental care discussed.     Lab results discussed in detail    Seizure (HCC/RAF)  Grand mal seizure (HCC/RAF)  Has been stable for more than 10 yrs  Continue with depakote 250 mg qam and 500mg  qpm  Level is normal   May be interested to go off.      -     Referral to Neurology, Epilepsy  -     divalproex 250 mg 24 hr tablet; Take one tablet qam..  -     divalproex 500 mg 24 hr tablet; Take 1 tablet (500 mg total) by mouth at bedtime.    Encounter to discuss test results  Results discussed in details  Questions answered.    Mild intermittent reactive airway disease with wheezing without complication  I have discussed the potential causes, evaluation and treatment management in detail with patient.  Likely contributed to his intermittent cough.   Will d/c flovent.   Start advair   Albuterol prn     -     albuterol 90 mcg/act inhaler; Inhale 1 puff every four (4) hours as needed (shortness of breath or wheezing).     -     fluticasone-salmeterol 45-21 mcg/act inhaler; Inhale 2 puffs two (2) times daily Rinse mouth after each use..    Macrocytic anemia  Low vitamin B12 level  STarted on B12 supplement 1000 mcg po every day.   Continue to monitor    Idiopathic gout of right wrist, unspecified chronicity  Stable   No flare up.   Continue with allopurinol 300mg  po every day  Uric acid 4.8    -     allopurinol 300 mg tablet; Take 1 tablet (300 mg total) by mouth daily.        Hyperlipidemia, unspecified hyperlipidemia type  Stable. Good control   Continue with simvastatin 20 mg po qhs.   Normal liver function   -     simvastatin 20 mg tablet; Take 1 tablet (20 mg total) by mouth at bedtime.    Alcohol use  .audit = 4   counseling the patient on cutting down alcohol intake, the risks of alcohol misuse such as liver dysfunction, cirrhosis, cardiovascular disease, hypertension, death.   Patient also inform to drink responsibly  No drinking and driving.     Prediabetes  Stable.   a1c 6.3   Life style modification.       Hx of colonic polyp   sp colonoscopy 2020 with polyps.     Irritable bowel syndrome, unspecified type  Stable.   Continue with metameucil per Dr. Michael Litter  Has not seen GI       BMI 3.0-34.9,adult  Worsening.   counseling the patient on weight loss techniques, the risks of obesity such as diabetes, hypertension, cardiovascular disease, hyperlipidemia, and their associated complications.  Also counseled patient on the  benefits of weight loss. Patient understands the risks as discussed above.            Healthcare Maintenance  Health Maintenance   Topic Date Due   ??? Hepatitis B Screening  Never done   ??? Lung Cancer Screening  Never done   ??? Annual Preventive Wellness Visit  12/26/2019   ??? Tdap/Td Vaccine (3 - Td or Tdap) 10/16/2026   ??? Colorectal Cancer Screening  10/08/2028   ??? Influenza Vaccine  Completed   ??? Hepatitis C Screening  Completed   ??? Shingles (Shingrix) Vaccine  Completed   ??? COVID-19 Vaccine(Tracks primary and booster doses, not sup/immunocomp)  Completed   ??? HIV Screening  Completed   ??? Statin prescribed for ASCVD Prevention or Treatment  Completed       Immunizations   Immunization History   Administered Date(s) Administered   ??? COVID-19, mRNA, (Moderna) 100 mcg/0.5 mL 06/19/2019, 07/17/2019, 01/28/2020, 06/25/2020   ??? Influenza vaccine IM quadrivalent (Afluria Quad) (PF) SYR (64 years of age and older) 02/07/2015, 01/27/2016, 12/13/2016   ??? Td 10/05/2003   ??? Tdap 04/12/2009, 10/15/2016   ??? Typhoid Inactivated, ViCPs 10/05/2003   ??? influenza vaccine IM cell culture quadrivalent (Flucelvax QUAD) (PF) SYR (35 years of age and older) 12/11/2019   ??? influenza vaccine IM quadrivalent (Fluarix Quad) (PF) SYR (29 months of age and older) 03/11/2018, 12/26/2018   ??? influenza, unspecified formulation 02/07/2015, 12/31/2015, 01/27/2016, 12/13/2016   ??? zoster vac recomb adjuvanted (Shingrix) 03/29/2018, 05/31/2018        Stressed the importance of good eating habits, regular routine exercise at least 5 days per week at moderate intensity for at least .  Calcium supplementation for  age group and medical comorbidities was also reviewed.       RTC:  Return in about 3 months (around 10/14/2020) for 30 minutes follow up on inhaler.    Today's visit lasted more than 35 minutes of which greater than 50% was spent in counseling, coordination of care and a detailed question and answer session regarding the pathophysiology, etiology, and treatment options available including the risks and benefits related to the medical issues pertinent to this encounter.  The date of encounter was when the patient was seen and does not reflect when the note was signed. The above plan of care, diagnosis, orders, and follow-up were discussed with the patient.  Questions related to this recommended plan of care were answered.     Portion of this chart may have been created with [employer].  Occasional wrong word or sound-alike substitution may have occurred due to the inherent limitation of voice recognition software.  Please read the chart carefully and recognize, using context, where these substitutions have occurred       Akshaj Besancon S. Regino Schultze, MD 07/15/2020 5:20 PM     Assistant Clinical Professor  Internal Medicine  [facility]  [address]  [place], [place] [address]  Office:[phone]

## 2020-07-15 NOTE — Patient Instructions
1. Stop the Flovent.   Start Advair inhaler once you get it. (             Understanding Reactive Airways Dysfunction Syndrome (RADS)  Reactive airways dysfunction syndrome (RADS) is an asthma-like condition. RADS can occur after a single exposure to very high levels of an irritant. Or it can happen after repeated exposures to low or moderate levels of an irritant. An irritant is a substance that can cause swelling, tightening (narrowing), or other harmful effects of the airways in the lungs. This makes it hard to breathe. An irritant may be a chemical, dust, gas, smoke, fumes, or vapor. RADS can occur after you breathe in an irritant at home, at work, or in the environment.   RADS is not an allergic reaction. People with RADS typically have no past history of asthma or other lung diseases. But RADS can become a long-term (chronic) condition. If that occurs, your quality of life may be affected.     Symptoms of RADS  Symptoms occur right away or within 24 hours after you are exposed to the irritant. RADS symptoms are very much like asthma symptoms. They can be mild or severe. Symptoms may include:   ??? Burning feeling in the throat and nose  ??? Cough  ??? Wheezing  ??? Chest tightness  ??? Trouble breathing  ??? Shortness of breath  ??? Swelling of the throat  ??? Tearing  ??? Rapid heart rate  ??? Conjunctivitis  Symptoms may last up to 1 year. They often respond well to treatment. But in some cases, there may be long-term airway damage.   Who is at risk for RADS?  Anyone can get RADS. But you are at higher risk if your job exposes you to irritants. Jobs include:   ??? Firefighters  ??? Police  ??? Cleaners  ??? Hospital workers  ??? Farmers  ??? Emergency service workers  Diagnosing RADS  If RADS symptoms occur within 24 hours after being exposed to an irritant, see your healthcare provider right away. The provider will ask about your health history and your exposure to irritants. You may have tests done such as:   ??? Chest X-ray  ??? Breathing test (spirometry)  ??? Blood tests such as a complete blood count (CBC) and differential  ??? Tests to see the oxygen level in your blood (pulse oximetry or arterial blood gas test)  You may be referred to a healthcare provider who specializes in asthma or allergies. This provider may give you other tests to figure out the best treatment option for you. These tests may include:   ??? Allergy skin testing  ??? Immunologic testing  ??? Bronchoprovocation challenge test, such as a methacholine challenge test  ??? Pulmonary function testing  Treatment for RADS  Treatment for RADS is the same as treatment for asthma. You will be given medicines to prevent and ease your symptoms. You will also need to stay away from the irritant or any other substances that may trigger your symptoms.   There are different types of asthma medicines. You may need to take more than one. Talk with your healthcare provider to be sure you know how to use all of your medicines correctly. It may be helpful to create an Action Plan for RADS with your provider. This should be updated each year, or at provider visits.   Medicines used to treat RADS include:  ??? Quick-relief (rescue) medicine.  These medicines are fast-acting. They give you quick relief when  your symptoms start. They relax and open up the airways. Take these medicines only when needed. Always carry this medicine with you.  ??? Long-term control (maintenance) medicine.  These medicines help to relax the muscles around your airways. They are often taken on a schedule. Don't skip doses unless your doctor tells you to.  ??? Inhaled corticosteroids.  These medicines work to reduce airway swelling and inflammation. They are often used when you have more severe symptoms or symptoms that continue. They open up the airways. They are inhaled right into the bronchial tubes of your lungs.  If your symptoms are severe enough, you may also need to take steroids by mouth.   Living with RADS  ??? Stay away from the irritant that caused your symptoms. If the exposure occurred at home, switch to a substance or product that is less irritating. If your RADS is job-related, talk with your employer. You may be able to work in the same area if you take your medicine and use safety measures. Talk with your healthcare provider about what kind of safety measures you may need at work. You should also be closely watched to be sure you are not having any breathing issues.  ??? If you smoke, stop smoking.  ??? Stay away from secondhand smoke.  ??? Stay away from other irritants that may trigger your symptoms.    When to call your healthcare provider  Call your healthcare provider right away if you have any of the following:   ??? Feeling dizzy, faint, or weak  ??? Lasting cough  ??? Fast heartbeat or pulse  ??? Having trouble doing normal activities  ??? Wheezing when you breathe in or out that is different from your normal breathing pattern  ??? Wheezing or chest tightness that gets worse, even after taking medicine  Call 911  Call 911 right away if you have any of the following:   ??? Trouble walking or talking  ??? Fingernails or lips turn blue or gray  ??? Trouble breathing  ??? Nostrils flare when you breathe  ??? With each breath, your chest, ribs, or neck are pulled in  ??? You take 30 or more breaths per minute  [employer] last reviewed this educational content on 05/25/2018    ??? 2000-2021 [employer], [place]. All rights reserved. This information is not intended as a substitute for professional medical care. Always follow your healthcare professional's instructions.

## 2020-07-19 ENCOUNTER — Ambulatory Visit: Payer: BLUE CROSS/BLUE SHIELD

## 2020-09-01 ENCOUNTER — Ambulatory Visit: Payer: BLUE CROSS/BLUE SHIELD

## 2020-09-05 ENCOUNTER — Ambulatory Visit: Payer: BLUE CROSS/BLUE SHIELD | Attending: Neurology

## 2020-09-05 DIAGNOSIS — R569 Unspecified convulsions: Secondary | ICD-10-CM

## 2020-09-05 MED ORDER — DIVALPROEX SODIUM ER 250 MG PO TB24
250 mg | ORAL_TABLET | Freq: Two times a day (BID) | ORAL | 3 refills | Status: AC
Start: 2020-09-05 — End: ?

## 2020-09-05 NOTE — Patient Instructions
Scheduling MRI: [phone]  Scheduling EEG: [phone]

## 2020-09-05 NOTE — Progress Notes
EPILEPSY CLINIC CONSULT NOTE    PATIENT:  Joseph Donovan  MRN:  [id number]  DOB:  Aug 07, 1955  DATE OF SERVICE:  09/05/2020    REFERRING PHYSICIAN: Garret Reddish., MD    CHIEF COMPLAINT/REASON FOR CONSULT: medication management    Subjective:     History of Present Illness:  Joseph Donovan is a 65 y.o. left-handed (taught to use right hand) male with a history of epilepsy, gout, high cholesterol, and asthma, presenting for medication management.    The patient states that he began having seizures at age 16. He was started on a barbiturate (thinks it was phenobarbital) and later switched to depakote 500/1000. His seizures became nocturnal after a couple years (noticed in the morning by the presence of severe headache) and then went away completely. He is not sure exactly when his last seizure was, but does not think he had any after starting university. His seizures are described as LOC with foaming at the mouth, tongue biting, and shaking in all extremities, consistent with tonic-clonic seizures. He vaguely remembers having a sensation occurring 1-2 minutes before some seizures, but does not remember what it was. He is not aware of being diagnosed with any specific type of epilepsy. He has had EEG in the distant past and does not know the findings. Had recent MRI in 2020 that was normal. There have been no events concerning for nocturnal seizures.    He denies history of staring spells and morning myoclonus. States he always did pretty well in school. He is currently on depakote 250/500 and tolerating well except for slight bilateral hand tremor. He previously thought about decreasing meds but did not due to not wanting to interfere with work. He is now nearing retirement and would like to avoid the SE of Depakote.    Seizure Semiology:  LOC with shaking in extremities, +tongue biting. Often nocturnal. May have had a warning with seizures out of wakefulness, but details are not remembered.  Began in 10 and was over by university.    Past Medical History:  Past Medical History:   Diagnosis Date   ? Cyst of kidney, acquired    ? Epilepsy (HCC/RAF)    ? Gout    ? Gout of wrist    ? Hx of colonic polyp    ? Hyperlipidemia    ? IBS (irritable bowel syndrome)     diarrhea   ? Macrocytic anemia 07/15/2020   ? Prediabetes    ? Seasonal allergies    ? Skin cancer      Seizure Risk Factors:  Birth/Development: unremarkable  History of febrile seizures: no  History of CNS infection: no  History of stroke: no  History of head trauma: no    NES Risk Factors:  Psychiatric comorbidities: not addressed  History of physical, sexual, or emotional abuse: not addressed    Family History:  Father passed away of a brain tumor. Paternal uncle and his son may have had brain tumor. Relative on father's side may have had seizure. Brother may have had 1-2 seizures.     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 Lives with wife.   Tobacco: quit many years ago  Alcohol: a couple glasses of wine on weekends  Recreational Drugs: no    Medications:  Prior to Admission medications    Medication Sig Start Date End Date Taking? Authorizing Provider   albuterol 90 mcg/act inhaler Inhale 1 puff every four (4) hours as needed (shortness  of breath or wheezing). 07/15/20 07/15/21 Yes Garret Reddish., MD   allopurinol 300 mg tablet Take 1 tablet (300 mg total) by mouth daily. 07/15/20  Yes Garret Reddish., MD   divalproex 250 mg 24 hr tablet Take one tablet qam.. 07/15/20  Yes Garret Reddish., MD   divalproex 500 mg 24 hr tablet Take 1 tablet (500 mg total) by mouth at bedtime. 07/15/20 07/15/21 Yes Garret Reddish., MD   fish oil 1000 mg capsule Take by mouth.   Yes [provider]   fluticasone-salmeterol 45-21 mcg/act inhaler Inhale 2 puffs two (2) times daily Rinse mouth after each use.. 07/15/20  Yes Garret Reddish., MD   multivitamin tablet Take by mouth daily.   Yes [provider]   simvastatin 20 mg tablet Take 1 tablet (20 mg total) by mouth at bedtime. 07/15/20  Yes Garret Reddish., MD   cyanocobalamin 1000 mcg tablet Take 1,000 mcg by mouth. 10/31/17   [provider]     Prior/Failed Medications:  Phenobarbital?    Allergies:  Patient has no known allergies.    Review of Systems:  Paper form completed and placed in chart. Fourteen point review of systems negative except as described in HPI.    Objective:     Vitals: BP 143/81  ~ Pulse 80  ~ Temp 37.1 ?C (98.8 ?F)  ~ Ht 6' 1'' (1.854 m)  ~ Wt (!) 243 lb 6.4 oz (110.4 kg)  ~ BMI 32.11 kg/m?      General: In no acute distress  Head: Normocephalic, atraumatic. External ears and nose normal.   Eyes: Normal sclerae and conjunctivae bilaterally.  Pulmonary: No accessory muscle use or respiratory distress.  Psych: Normal mood with congruent affect.    Mental status: Alert and oriented. Speech clear and fluent without paraphasias. Answers questions appropriately. Fund of knowledge good.   Cranial nerves: Gaze conjugate, no nystagmus. Face symmetrical with facial movements preserved. Hearing grossly intact.  Motor: No adventitious movements.   Coordination: Grossly intact.  Gait/Station: Normal casual gait.    Studies:  04/23/18 MRI brain w-wo contrast  No infarct, intracranial hemorrhage, mass effect or abnormal enhancement.     Assessment:     Joseph Donovan is a 65 y.o. left-handed (taught to use right hand) male with a history of epilepsy, gout, high cholesterol, and asthma, presenting for medication management.    Seizure semiology suggestive of GTCs, although patient reports possibly having an aura. Seizure-free for >40 years, currently on depakote ER 250/500. 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 Will get MRI and EEG to see if we can learn anything about epilepsy etiology and seizure risk.    Regarding medication taper, advised patient that he would need to refrain from driving during the taper and for ~3 months after stopping depakote. He still needs to drive to work so will hold off on discontinuing until retirement. In the meantime, will reduce depakote to 250mg  BID given tremor SE.    Plan/ Recommendation:     - decrease depakote ER from 250/500 to 250mg  BID  - MRI epilepsy protocol w-wo contrast  - routine EEG    RTC following MRI, EEG, or sooner if needed  Patient and plan discussed with attending, Dr. Rennis Chris.    Author:  Deedra Ehrich. Ho, MD, PhD 09/05/2020 1:14 PM      Contemporaneous to this patient visit, I have discussed the history, physical exam  and medical decision making with the fellow.  I was immediately available for the key portions of this service.    Volney Presser.

## 2020-09-07 ENCOUNTER — Telehealth: Payer: BLUE CROSS/BLUE SHIELD

## 2020-09-07 NOTE — Telephone Encounter
Call came in through the provider line. Deborah with [employer] [facility] in regards to pt's medication (divalproex 250 mg 24 hr tablet). She would like to clarify the prescription. She would like to know if pt should take 1 tablet in the mornings or once x twice a day.     Please assist.     [facility] [phone]

## 2020-09-07 NOTE — Telephone Encounter
[place] back for clarification on divalproex. Advised per drs notes patient is to reduce depakote to 250mg  BID.

## 2020-09-22 ENCOUNTER — Inpatient Hospital Stay: Payer: BLUE CROSS/BLUE SHIELD | Attending: Neurology

## 2020-09-22 DIAGNOSIS — R569 Unspecified convulsions: Secondary | ICD-10-CM

## 2020-10-07 ENCOUNTER — Ambulatory Visit: Payer: BLUE CROSS/BLUE SHIELD | Attending: Neurology

## 2020-10-14 MED ORDER — ADVAIR HFA 45-21 MCG/ACT IN AERO
2 refills | Status: AC
Start: 2020-10-14 — End: ?

## 2020-10-25 ENCOUNTER — Ambulatory Visit: Payer: BLUE CROSS/BLUE SHIELD

## 2020-11-15 ENCOUNTER — Inpatient Hospital Stay: Payer: BLUE CROSS/BLUE SHIELD | Attending: Neurology

## 2020-11-15 DIAGNOSIS — R569 Unspecified convulsions: Secondary | ICD-10-CM

## 2020-11-15 MED ADMIN — GADOBUTROL 1 MMOL/ML IV SOLN: 10 mL | INTRAVENOUS | @ 16:00:00 | Stop: 2020-11-15 | NDC 50419032512

## 2020-12-20 ENCOUNTER — Ambulatory Visit: Payer: BLUE CROSS/BLUE SHIELD

## 2020-12-29 ENCOUNTER — Ambulatory Visit: Payer: BLUE CROSS/BLUE SHIELD

## 2020-12-29 DIAGNOSIS — E785 Hyperlipidemia, unspecified: Secondary | ICD-10-CM

## 2021-01-04 DIAGNOSIS — Z86010 Personal history of colonic polyps: Secondary | ICD-10-CM

## 2021-01-04 DIAGNOSIS — G40409 Other generalized epilepsy and epileptic syndromes, not intractable, without status epilepticus: Secondary | ICD-10-CM

## 2021-01-04 DIAGNOSIS — Z712 Person consulting for explanation of examination or test findings: Secondary | ICD-10-CM

## 2021-01-04 DIAGNOSIS — E785 Hyperlipidemia, unspecified: Secondary | ICD-10-CM

## 2021-01-04 DIAGNOSIS — M10031 Idiopathic gout, right wrist: Secondary | ICD-10-CM

## 2021-01-04 DIAGNOSIS — K589 Irritable bowel syndrome without diarrhea: Secondary | ICD-10-CM

## 2021-01-04 DIAGNOSIS — J4520 Mild intermittent asthma, uncomplicated: Secondary | ICD-10-CM

## 2021-01-04 NOTE — Progress Notes
Patient: Joseph Donovan  MRN: [id number]  Date of Service: 01/05/2021  PCP : Garret Reddish., MD      CC:   Chief Complaint   Patient presents with   ? Follow-up     History of Present Illness:     Joseph Donovan is a 65 y.o. male with  has a past medical history of Cyst of kidney, acquired, Epilepsy (HCC/RAF), Gout, Gout of wrist, colonic polyp, Hyperlipidemia, IBS (irritable bowel syndrome), Macrocytic anemia (07/15/2020), Prediabetes, Seasonal allergies, and Skin cancer. presented   Chief Complaint   Patient presents with   ? Follow-up     Last vist 4/22  Lab done recently      Last visit, patient referred to seizure clinic and established with Dr. Allyson Sabal.  depakoke ER decreased from 250/500 down to 250 bid.  No seizure activities.    had MRI brain  and EEG done which are no significant finding.       started advair last visit for intermittent wheezing and reactive airway disease.  Symptoms was better until he went on vacation in [place] back in September.   Recently had covid infection , no need for paxlovid.   Has symptom of exacerbation.   Still has some fatigue and decreased sense of smeel. .  Felt some congestion in the chest still.   Energy is better but not normal.    Taking OTC medication for past 2 weeks.  No fever.     Denies history of hypertension.                  Health Maintenance   Topic Date Due   ? Hepatitis B Screening  Never done   ? COVID-19 Vaccine(Tracks primary and booster doses, not sup/immunocomp) (5 - Booster for Moderna series) 01/27/2021   ? Annual Preventive Wellness Visit  07/15/2021   ? Colorectal Cancer Screening  10/09/2023   ? Prediabetes Screening (See hover text)  01/03/2024   ? Tdap/Td Vaccine (3 - Td or Tdap) 10/16/2026   ? Influenza Vaccine  Completed   ? Hepatitis C Screening  Completed   ? Shingles (Shingrix) Vaccine  Completed   ? HIV Screening  Completed   ? Statin prescribed for ASCVD Prevention or Treatment  Completed            Past Medical History:      Past Medical History:   Diagnosis Date   ? Cyst of kidney, acquired    ? Epilepsy (HCC/RAF)    ? Gout    ? Gout of wrist    ? Hx of colonic polyp    ? Hyperlipidemia    ? IBS (irritable bowel syndrome)     diarrhea   ? Macrocytic anemia 07/15/2020   ? Prediabetes    ? Seasonal allergies    ? Skin cancer        Past Surgical History:      Past Surgical History:   Procedure Laterality Date   ? TONSILLECTOMY         Allergy:  No Known Allergies     Medication:       Medications that the patient states to be currently taking   Medication Sig   ? ADVAIR HFA 45-21 MCG/ACT inhaler Inhale 2 puffs by mouth twice daily. Rinse mouth after each use.   ? albuterol 90 mcg/act inhaler Inhale 1 puff every four (4) hours as needed (shortness of breath or wheezing).   ? allopurinol  300 mg tablet Take 1 tablet (300 mg total) by mouth daily.   ? cyanocobalamin 1000 mcg tablet Take 1,000 mcg by mouth.   ? divalproex 250 mg 24 hr tablet Take 1 tablet (250 mg total) by mouth two (2) times daily Take one tablet qam..   ? fish oil 1000 mg capsule Take by mouth.   ? multivitamin tablet Take by mouth daily.   ? simvastatin 20 mg tablet Take 1 tablet (20 mg total) by mouth at bedtime.         Family History:   Family History   Problem Relation Age of Onset   ? Depression Mother    ? Meniere's disease Mother    ? Brain cancer Father 6          Social History:    reports that he quit smoking about 13 years ago. His smoking use included cigarettes. He has a 15.00 pack-year smoking history. He has never used smokeless tobacco. He reports current alcohol use of about 2.4 oz of alcohol per week. He reports previous drug use.  Social History     Social History Narrative    Married with step daughter.         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     Has been working from home.             REVIEW OF SYSTEMS   []  10 point ROS negative: pertinent negatives and positives noted in HPI      Review of Systems   Constitutional: Negative.    HENT: Negative.    Eyes: Negative.    Respiratory: Positive for cough.    Cardiovascular: Negative.    Gastrointestinal: Negative.    Genitourinary: Negative.    Musculoskeletal: Negative.    Skin: Negative.    Neurological: Negative.    Endo/Heme/Allergies: Negative.    Psychiatric/Behavioral: Negative.              Physical Exam:     BP 147/74  ~ Pulse 80  ~ Temp 36.5 ?C (97.7 ?F) (Forehead)  ~ Resp 16  ~ Ht 6' 1'' (1.854 m)  ~ Wt (!) 242 lb (109.8 kg)  ~ SpO2 96%  ~ BMI 31.93 kg/m?       Physical Exam  Constitutional:       General: He is not in acute distress.     Appearance: He is not diaphoretic.   HENT:      Head: Normocephalic and atraumatic.      Right Ear: Tympanic membrane, ear canal and external ear normal.      Left Ear: Tympanic membrane, ear canal and external ear normal.      Nose: Nose normal.      Right Sinus: No maxillary sinus tenderness or frontal sinus tenderness.      Left Sinus: No maxillary sinus tenderness or frontal sinus tenderness.      Mouth/Throat:      Pharynx: Uvula midline.   Neck:      Trachea: Trachea normal.   Cardiovascular:      Rate and Rhythm: Normal rate and regular rhythm.      Pulses: Normal pulses.      Heart sounds: Normal heart sounds. No murmur heard.    No gallop.  Pulmonary:      Effort: Pulmonary effort is normal.      Breath sounds: Normal breath sounds. No stridor. No decreased breath sounds, wheezing, rhonchi or rales.   Abdominal:      General: Bowel sounds are normal.      Palpations: Abdomen is soft.      Tenderness: There is no abdominal tenderness.      Hernia: No hernia is present.   Musculoskeletal:      Cervical back: Normal, normal range of motion and neck supple. No spinous process tenderness or muscular tenderness.      Thoracic back: Normal.      Lumbar back: Normal.   Lymphadenopathy:      Cervical: No cervical adenopathy.   Skin: General: Skin is warm.   Neurological:      Mental Status: He is alert and oriented to person, place, and time.      Sensory: Sensation is intact.      Motor: Motor function is intact.      Coordination: Coordination is intact.      Gait: Gait is intact.   Psychiatric:         Mood and Affect: Mood and affect normal.         Speech: Speech normal.         Behavior: Behavior normal.         Thought Content: Thought content normal.         Cognition and Memory: Cognition normal.         Judgment: Judgment normal.          LABS:    Lab Results   Component Value Date    HGBA1C 6.3 (H) 01/02/2021       No results found for: [facility]  Lab Results   Component Value Date    WBC 6.71 01/02/2021    HGB 13.2 (L) 01/02/2021    HCT 40.8 01/02/2021    MCV 104.3 (H) 01/02/2021    PLT 296 01/02/2021     Lab Results   Component Value Date    CREAT 0.97 01/02/2021    BUN 19 01/02/2021    NA 139 01/02/2021    K 5.1 01/02/2021    CL 104 01/02/2021    CO2 26 01/02/2021     Lab Results   Component Value Date    HGBA1C 6.3 (H) 01/02/2021     Lab Results   Component Value Date    ALT 15 01/02/2021    AST 20 01/02/2021    ALKPHOS 60 01/02/2021    BILITOT 0.7 01/02/2021     Lab Results   Component Value Date    TSH 2.3 07/06/2020     Lab Results   Component Value Date    CALCIUM 9.4 01/02/2021     Lab Results   Component Value Date    CHOL 168 01/02/2021    CHOLHDL 46 01/02/2021    CHOLDLCAL 94 01/02/2021    TRIGLY 142 01/02/2021     No results found for: INR, INRPOC, PT    I have   [] reviewed radiology,  [] reviewed labs, [] reviewed diag med test, [] reviewed & summarized old records, [] requested outside medical records.       REVIEW OF DATA   I have:   [x]  Reviewed/ordered []  1 []  2 [x]  ? 3 unique laboratory, radiology, and/or diagnostic tests noted below    []  Reviewed []  1 []  2 []  ? 3 prior external notes and incorporated  into patient assessment    []  Discussed management or test interpretation with external provider(s) as noted      Lab Studies:     Imaging Studies:  reviewed by me    XR chest pa+lat (2 views)    Result Date: 06/07/2020  IMPRESSION:  Diffuse bronchial wall thickening, suggestive of underlying airways disease/inflammation. No evidence of pneumonia. Signed by: Ledon Snare   06/07/2020 7:55 AM    MR brain wo+w contrast epilepsy    Result Date: 11/16/2020  IMPRESSION: No structural abnormality is identified to explain epilepsy. No significant neural change since prior exam. Postcontrast images are not available at the time of interpretation. An addendum will be added after receiving images. Signed by: Trudi Ida   11/15/2020 6:32 PM      CHART REVIEW    I reviewed these specialist notes that directly relate to the patient's acute and/or chronic medical problems with documentation of the salient findings, if any:  Deedra Ehrich HO, MD, PhD in Neurology, Epilepsy on 09/07/2020.}      ASSESSMENT & PLAN   EMMIT ORILEY is a 65 y.o. male  has a past medical history of Cyst of kidney, acquired, Epilepsy (HCC/RAF), Gout, Gout of wrist, colonic polyp, Hyperlipidemia, IBS (irritable bowel syndrome), Macrocytic anemia (07/15/2020), Prediabetes, Seasonal allergies, and Skin cancer.  presents for   Chief Complaint   Patient presents with   ? Follow-up         DIAGNOSES/ORDERS        Encounter to discuss test results  Results discussed in details  Questions answered.     Seizure (HCC/RAF)  Grand mal seizure (HCC/RAF)     Established with neurologist Dr Allyson Sabal.   depakote decreased from 250/500 to 250 bid.   Level is lower this time.   MRI brain no significant finding.   EEG no seizure activities.  Per patient, goal is to be off sometime next year after he retires.       Mild intermittent reactive airway disease with wheezing without complication  History of covid infection   currently lung exam is good.   Continue with advair bid  Was not using albuterol,  Instruct patient to use albuterol prn for symptom control   Continue to monitor symptom. Macrocytic anemia  Low vitamin B12 level     Level improved  on B12 supplement 1000 mcg po every day.   Continue to monitor    Idiopathic gout of right wrist, unspecified chronicity     Stable   No flare up.   Continue with allopurinol 300mg  po every day  Uric acid 4.8         Hyperlipidemia, unspecified hyperlipidemia type     Stable. Good control   Continue with simvastatin 20 mg po qhs.   Normal liver function       Prediabetes     Stable.   a1c 6.3 ( 12/2020)  Life style modification.       Hx of colonic polyp   sp colonoscopy 2020 with polyps.     Irritable bowel syndrome, unspecified type  Stable.   Continue with metameucil per Dr. Michael Litter  Has not seen GI      History of COVID-19  Recently recovered from covid.   Fatigue and cough likely related to the infection.   Continue to monitor and supportive care.          Elevated BP without diagnosis of hypertension   Discussed the potential adverse side  effect of prolong high blood pressure extensively, ie decrease kidney function, CHF, MI, stroke, end organ damage, etc.  Encouraging Dash diet and routine exercise.  - BP log at home  - dash diet  - avoid caffeinated drink and food.   - routine exercise  - if increase bp, will consider start medication.             Return in about 3 months (around 04/07/2021) for 3-4 months for follow , blood pressure follow up, ok for SD/MD or SB block, telemedicine appt.          Medical Decision Making addressed on 01/05/2021    :      Number and Complexity of Problems Addressed at the Encounter:   []   2 or more self-limited or minor problems     []   1 stable chronic illness  []   1 acute, uncomplicated illness or injury   [x]   1 or more chronic illness with exacerbation, progression, or side effects of treatment  [x]   2 or more stable chronic illness  []   1 undiagnosed new problem with uncertain diagnosis  []   1 acute illness with systemic symptoms  []   1 acute complicated injury   Review of Data: I have  []  Reviewed/ordered ? 2 unique laboratory, radiology, and/or diagnostic tests noted    [x]  Reviewed/ordered ? 3 unique laboratory, radiology, and/or diagnostic tests noted    [x]  Reviewed prior external notes and incorporated into patient assessment as noted  []  I have independently interpreted test performed by other physician(s) as noted   []  Discussed management or test interpretation with external provider(s) as noted     Risk of Complication and or Morbidity or Mortality of Patient Management including Social Determinants of Health:   []   I deem the above diagnoses to have a risk of complication, morbidity or mortality of Low  [x]   I deem the above diagnoses to have a risk of complication, morbidity or mortality of Moderate   []  The diagnosis or treatment of said conditions is significantly limited by social determinants of health as noted.        The above recommendation were discussed with the patient. The patient has all questions answered satisfactorily and is in agreement with this recommended plan of care.    Author:  Wayne Both. Shamyah Stantz 01/05/2021 1:35 PM

## 2021-01-05 ENCOUNTER — Ambulatory Visit: Payer: BLUE CROSS/BLUE SHIELD

## 2021-01-05 DIAGNOSIS — Z8616 History of COVID-19: Secondary | ICD-10-CM

## 2021-01-05 DIAGNOSIS — R030 Elevated blood-pressure reading, without diagnosis of hypertension: Secondary | ICD-10-CM

## 2021-01-05 NOTE — Patient Instructions
Your blood pressure is elevated this visit:    -  Please monitor your blood pressure at home, and bring your blood pressure log to your next visit  -  The best time to check your blood pressure is either first in the morning after urinating, or right before bedtime  - Check BP once a day, no more than twice a day  -  Try decreasing the amount of salt in your diet (increased salt leads to water retention)-- Esp watch dining out and processed foods!  -  Do your best to exercise regularly. You should aim for at least 30 mins of brisk exercise, 3-5 times a week (pushing yourself so that you break a sweat or become breathless)        Continue with advair daily.   Use your albuterol as needed for any symptoms related to your asthma.

## 2021-01-12 ENCOUNTER — Ambulatory Visit: Payer: BLUE CROSS/BLUE SHIELD

## 2021-01-12 DIAGNOSIS — L989 Disorder of the skin and subcutaneous tissue, unspecified: Secondary | ICD-10-CM

## 2021-02-19 MED ORDER — DIVALPROEX SODIUM ER 250 MG PO TB24
ORAL_TABLET | 2 refills | 30.00000 days
Start: 2021-02-19 — End: ?

## 2021-02-20 MED ORDER — DIVALPROEX SODIUM ER 250 MG PO TB24
ORAL_TABLET | 0 refills | Status: AC
Start: 2021-02-20 — End: ?

## 2021-03-08 ENCOUNTER — Ambulatory Visit: Payer: BLUE CROSS/BLUE SHIELD

## 2021-03-09 NOTE — Telephone Encounter
Visit changed to in person.    Future Appointments      Apr 14, 2021 10:30 AM  RETURN with Amy S. Mina Marble, MD  [facility] (MEDICINE) [address]  [place]  [place] [address]  [phone]

## 2021-03-30 ENCOUNTER — Ambulatory Visit: Payer: BLUE CROSS/BLUE SHIELD

## 2021-04-14 ENCOUNTER — Ambulatory Visit: Payer: BLUE CROSS/BLUE SHIELD

## 2021-04-14 DIAGNOSIS — M79644 Pain in right finger(s): Secondary | ICD-10-CM

## 2021-04-14 DIAGNOSIS — I10 Essential (primary) hypertension: Secondary | ICD-10-CM

## 2021-04-14 DIAGNOSIS — J4521 Mild intermittent asthma with (acute) exacerbation: Secondary | ICD-10-CM

## 2021-04-14 MED ORDER — ALBUTEROL SULFATE HFA 108 (90 BASE) MCG/ACT IN AERS
1 | RESPIRATORY_TRACT | 2 refills | Status: AC | PRN
Start: 2021-04-14 — End: ?

## 2021-04-14 MED ORDER — LOSARTAN POTASSIUM 25 MG PO TABS
25 mg | ORAL_TABLET | Freq: Every day | ORAL | 3 refills | Status: AC
Start: 2021-04-14 — End: 2021-04-17

## 2021-04-14 MED ORDER — FLUTICASONE-SALMETEROL 115-21 MCG/ACT IN AERO
2 | Freq: Two times a day (BID) | RESPIRATORY_TRACT | 11 refills | Status: AC
Start: 2021-04-14 — End: ?

## 2021-04-14 NOTE — Patient Instructions
Trail of naproxen 220 mg twice per day with food for 1-2 weeks for your finger pain.

## 2021-04-14 NOTE — Progress Notes
Internal Medicine NOTE    PATIENT: Joseph Donovan  MRN: [id number]  DOB: 10/14/1955  DATE OF SERVICE: 04/14/2021  Garret Reddish., MD    HPI   Joseph Donovan is a 66 y.o. male  has a past medical history of Cyst of kidney, acquired, Epilepsy (HCC/RAF), Gout, Gout of wrist, colonic polyp, Hyperlipidemia, IBS (irritable bowel syndrome), Macrocytic anemia (07/15/2020), Prediabetes, Seasonal allergies, and Skin cancer. presents for   Chief Complaint   Patient presents with   ? Follow-up       Here for follow up for blood pressure   Home blood pressure reading is around  140-150s/ 80s.  Occasional 120s, but rare.   Brought in machine for validation      Has noted worsening asthma.  Recovering from flu/ uri couple of weeks ago.   Now noted wheezing.   + coughing, slight phlegm.   Symptoms started a month ago.   Cold weather makes it worse.    No fever.     Using advair 45 -21 mcg  bid daily.   Using more albuterol more.     Has noted more pain on his right 4th finger pain.   No trauma.   Has hard time forming fist.   Cannot straighten up as much.             PAST HISTORY         Past Medical History:      Past Medical History:   Diagnosis Date   ? Cyst of kidney, acquired    ? Epilepsy (HCC/RAF)    ? Gout    ? Gout of wrist    ? Hx of colonic polyp    ? Hyperlipidemia    ? IBS (irritable bowel syndrome)     diarrhea   ? Macrocytic anemia 07/15/2020   ? Prediabetes    ? Seasonal allergies    ? Skin cancer        Past Surgical History:      Past Surgical History:   Procedure Laterality Date   ? TONSILLECTOMY         Family History:   Family History   Problem Relation Age of Onset   ? Depression Mother    ? Meniere's disease Mother    ? Brain cancer Father 54        Social History:    reports that he quit smoking about 14 years ago. His smoking use included cigarettes. He has a 15.00 pack-year smoking history. He has never used smokeless tobacco. He reports current alcohol use of about 2.4 oz per week. He reports that he does not currently use drugs.  Social History     Social History Narrative    Married with step daughter.         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     Has been working from home.         Allergy:  No Known Allergies     MEDS     Medications that the patient states to be currently taking   Medication Sig   ? ADVAIR HFA 45-21 MCG/ACT inhaler Inhale 2 puffs by mouth twice daily. Rinse mouth after each use.   ? albuterol 90 mcg/act inhaler Inhale 1 puff  every four (4) hours as needed (shortness of breath or wheezing).   ? allopurinol 300 mg tablet Take 1 tablet (300 mg total) by mouth daily.   ? cyanocobalamin 1000 mcg tablet Take 1 tablet (1,000 mcg total) by mouth.   ? DIVALPROEX 250 mg 24 hr tablet Take 1 tablet by mouth twice daily.   ? fish oil 1000 mg capsule Take by mouth.   ? multivitamin tablet Take by mouth daily.   ? simvastatin 20 mg tablet Take 1 tablet (20 mg total) by mouth at bedtime.         REVIEW OF SYSTEMS   []  10 point ROS negative: pertinent negatives and positives noted in HPI   Review of Systems   Constitutional: Negative.    HENT: Negative.    Eyes: Negative.    Respiratory: Positive for cough and wheezing.    Cardiovascular: Negative.    Gastrointestinal: Negative.    Genitourinary: Negative.    Musculoskeletal: Positive for joint pain.   Skin: Negative.    Neurological: Negative.    Endo/Heme/Allergies: Negative.    Psychiatric/Behavioral: Negative.         PHYSICAL EXAM     BP 153/74  ~ Pulse 77  ~ Temp 36.8 ?C (98.2 ?F) (Forehead)  ~ Resp 14  ~ Ht 6' 1'' (1.854 m)  ~ Wt (!) 245 lb 9.6 oz (111.4 kg)  ~ SpO2 98%  ~ BMI 32.40 kg/m?       Physical Exam  Constitutional:       General: He is not in acute distress.  HENT:      Head: Normocephalic and atraumatic.   Cardiovascular:      Rate and Rhythm: Normal rate and regular rhythm.      Pulses: Normal pulses.      Heart sounds: Normal heart sounds. No murmur heard.    No gallop.   Pulmonary:      Effort: Pulmonary effort is normal.      Breath sounds: Wheezing present. No decreased breath sounds, rhonchi or rales.   Abdominal:      General: Bowel sounds are normal.   Musculoskeletal:        Hands:    Skin:     General: Skin is warm.   Neurological:      Mental Status: He is alert and oriented to person, place, and time.   Psychiatric:         Mood and Affect: Mood and affect normal.         REVIEW OF DATA   I have   []  Reviewed/ordered []  1 []  2 []  ? 3 unique laboratory, radiology, and/or diagnostic tests noted below    []  Reviewed []  1 []  2 []  ? 3 prior external notes and incorporated into patient assessment    []  Discussed management or test interpretation with external provider(s) as noted      Lab Studies:       LABS:  Reviewed today  Lab Results   Component Value Date    WBC 6.71 01/02/2021    HGB 13.2 (L) 01/02/2021    HCT 40.8 01/02/2021    MCV 104.3 (H) 01/02/2021    PLT 296 01/02/2021     Lab Results   Component Value Date    CREAT 0.97 01/02/2021    BUN 19 01/02/2021    NA 139 01/02/2021    K 5.1 01/02/2021    CL 104 01/02/2021    CO2 26 01/02/2021  Lab Results   Component Value Date    ALT 15 01/02/2021    AST 20 01/02/2021    ALKPHOS 60 01/02/2021    BILITOT 0.7 01/02/2021     Lab Results   Component Value Date    CHOL 168 01/02/2021    CHOLDLCAL 94 01/02/2021    TRIGLY 142 01/02/2021     Lab Results   Component Value Date    TSH 2.3 07/06/2020     Lab Results   Component Value Date    HGBA1C 6.3 (H) 01/02/2021         Imaging Studies: (reviewed by me)       MR brain wo+w contrast epilepsy    Result Date: 11/16/2020  IMPRESSION: No structural abnormality is identified to explain epilepsy. No significant neural change since prior exam. Postcontrast images are not available at the time of interpretation. An addendum will be added after receiving images. Signed by: Trudi Ida   11/15/2020 6:32 PM           ASSESSMENT & PLAN   Joseph Donovan is a 66 y.o. male presents for   Chief Complaint   Patient presents with   ? Follow-up       DIAGNOSES/ORDERS      Diagnoses and all orders for this visit:    Hypertension, essential  Machine validated today   Home blood pressure not ideal in 140-150s range.   Will start losartan 25 mg po every day  SE discussed   Continue with blood pressure log.     -     losartan 25 mg tablet; Take 1 tablet (25 mg total) by mouth daily.  -     Basic Metabolic Panel; Future; Expected date: 04/14/2021    Mild intermittent asthma with exacerbation  Currently moving air well.   Lung exam without sign of infection.   Possibly due to post viral bronchospasm.   Patient on low dose advair, will increase to 115-21 mcg bid   Albuterol prn .   Continue to monitors symptoms.     -     albuterol 90 mcg/act inhaler; Inhale 1 puff every four (4) hours as needed (shortness of breath or wheezing).  -     fluticasone-salmeterol 115-21 mcg/act inhaler; Inhale 2 puffs two (2) times daily Rinse mouth after each use.    Finger pain, right  - possibly tendonitis/ ligamental condition and arthritis.   Trial of naproxen 220 mg bid for 1-2 weeks.   Consider xray if no improvement.           Problem List Items Addressed This Visit    None  Visit Diagnoses     Hypertension, essential    -  Primary    Relevant Medications    losartan 25 mg tablet    Other Relevant Orders    Basic Metabolic Panel    Mild intermittent asthma with exacerbation        Relevant Medications    albuterol 90 mcg/act inhaler    fluticasone-salmeterol 115-21 mcg/act inhaler    Finger pain, right                     Return in about 6 weeks (around 05/26/2021) for blood pressure follow up, lab 1 week before office visit, ok for SD/MD or SB block.             Medical Decision Making addressed on 04/14/2021  :      Number  and Complexity of Problems Addressed at the Encounter:   []   2 or more self-limited or minor problems     []   1 stable chronic illness  []   1 acute, uncomplicated illness or injury   [x]   1 or more chronic illness with exacerbation, progression, or side effects of treatment  []   2 or more stable chronic illness  []   1 undiagnosed new problem with uncertain diagnosis  []   1 acute illness with systemic symptoms  []   1 acute complicated injury   Review of Data: I have  []  Reviewed/ordered ? 2 unique laboratory, radiology, and/or diagnostic tests noted    []  Reviewed/ordered ? 3 unique laboratory, radiology, and/or diagnostic tests noted    []  Reviewed prior external notes and incorporated into patient assessment as noted  []  I have independently interpreted test performed by other physician(s) as noted   []  Discussed management or test interpretation with external provider(s) as noted     Risk of Complication and or Morbidity or Mortality of Patient Management including Social Determinants of Health:   []   I deem the above diagnoses to have a risk of complication, morbidity or mortality of Low  [x]   I deem the above diagnoses to have a risk of complication, morbidity or mortality of Moderate   []  The diagnosis or treatment of said conditions is significantly limited by social determinants of health as noted.          The above recommendation were discussed with the patient. The patient has all questions answered satisfactorily and is in agreement with this recommended plan of care.      Author:  Wayne Both. Alejandria Donovan 04/14/2021 10:38 AM

## 2021-04-15 ENCOUNTER — Ambulatory Visit: Payer: BLUE CROSS/BLUE SHIELD

## 2021-04-16 ENCOUNTER — Ambulatory Visit: Payer: BLUE CROSS/BLUE SHIELD

## 2021-04-16 DIAGNOSIS — I10 Essential (primary) hypertension: Secondary | ICD-10-CM

## 2021-04-16 MED ORDER — LOSARTAN POTASSIUM 25 MG PO TABS
25 mg | ORAL_TABLET | Freq: Every day | ORAL | 3 refills | Status: AC
Start: 2021-04-16 — End: ?

## 2021-04-27 ENCOUNTER — Ambulatory Visit: Payer: BLUE CROSS/BLUE SHIELD

## 2021-04-27 ENCOUNTER — Telehealth: Payer: BLUE CROSS/BLUE SHIELD | Attending: Student in an Organized Health Care Education/Training Program

## 2021-04-27 DIAGNOSIS — G40309 Generalized idiopathic epilepsy and epileptic syndromes, not intractable, without status epilepticus: Secondary | ICD-10-CM

## 2021-04-27 NOTE — Progress Notes
EPILEPSY CLINIC CONSULT NOTE    PATIENT:  Joseph Donovan  MRN:  [id number]  DOB:  August 26, 1955  DATE OF SERVICE:  04/27/2021    REFERRING PHYSICIAN: Garret Reddish., MD  CHIEF COMPLAINT/REASON FOR CONSULT: medication management    Subjective:     History of Present Illness (09/05/2020):  Joseph Donovan is a 66 y.o. left-handed (taught to use right hand) male with a history of epilepsy, gout, high cholesterol, and asthma, presenting for medication management.    The patient states that he began having seizures at age 21. He was started on a barbiturate (thinks it was phenobarbital) and later switched to depakote 500/1000. His seizures became nocturnal after a couple years (noticed in the morning by the presence of severe headache) and then went away completely. He is not sure exactly when his last seizure was, but does not think he had any after starting university. His seizures are described as LOC with foaming at the mouth, tongue biting, and shaking in all extremities, consistent with tonic-clonic seizures. He vaguely remembers having a sensation occurring 1-2 minutes before some seizures, but does not remember what it was. He is not aware of being diagnosed with any specific type of epilepsy. He has had EEG in the distant past and does not know the findings. Had recent MRI in 2020 that was normal. There have been no events concerning for nocturnal seizures.    He denies history of staring spells and morning myoclonus. States he always did pretty well in school. He is currently on depakote 250/500 and tolerating well except for slight bilateral hand tremor. He previously thought about decreasing meds but did not due to not wanting to interfere with work. He is now nearing retirement and would like to avoid the SE of Depakote.    Interval History 04/27/2021:  Patient has not had any seizures for 35-40 years. Patient is still taking Depakote ER 250 mg BID. He continues to experience a slight tremor in his hands that improved with the decrease in Depakote dose, however the tremor continues to impact his function a bit, worse when he is holding objects (he spills when holding a glass of water).    Seizure Semiology:  LOC with shaking in extremities, +tongue biting. Often nocturnal. May have had a warning with seizures out of wakefulness, but details are not remembered (may be a smell or taste).  Began in 31 and was over by university.    Past Medical History:  Past Medical History:   Diagnosis Date   ? Cyst of kidney, acquired    ? Epilepsy (HCC/RAF)    ? Gout    ? Gout of wrist    ? Hx of colonic polyp    ? Hyperlipidemia    ? IBS (irritable bowel syndrome)     diarrhea   ? Macrocytic anemia 07/15/2020   ? Prediabetes    ? Seasonal allergies    ? Skin cancer      Past Surgical History:   Procedure Laterality Date   ? COLONOSCOPY  2020    Normal colo at [facility]. Needs to bring in pathology report. Had polyps   ? TONSILLECTOMY       No Known Allergies    Current Outpatient Medications   Medication Instructions   ? albuterol 90 mcg/act inhaler 1 puff, Inhalation, Every 4 hours PRN   ? allopurinol (ZYLOPRIM) 300 mg, Oral, Daily   ? cyanocobalamin (CYANOCOBALAMIN) 1,000 mcg, Oral   ? DIVALPROEX 250  mg 24 hr tablet Take 1 tablet by mouth twice daily.   ? fish oil 1000 mg capsule Oral   ? fluticasone-salmeterol 115-21 mcg/act inhaler 2 puffs, Inhalation, 2 times daily, Rinse mouth after each use   ? losartan (COZAAR) 25 mg, Oral, Daily   ? multivitamin tablet Oral, Daily   ? simvastatin (ZOCOR) 20 mg, Oral, Every night at bedtime     Seizure Risk Factors:  Birth/Development: unremarkable  History of febrile seizures: no  History of CNS infection: no  History of stroke: no  History of head trauma: no    NES Risk Factors:  Psychiatric comorbidities: not addressed  History of physical, sexual, or emotional abuse: not addressed    Current Anti-Seizure Medications:  Depakote ER 250 mg BID    Prior/Failed Anti-Seizure Medications:  Phenobarbital    Family History:  Father passed away of a brain tumor. Paternal uncle and his son may have had brain tumor. Relative on father's side may have had seizure. Brother may have had 1-2 seizures.    Social History:  Works at [employer] in [employer]. Lives with wife. Currently driving. Born in [place].  Tobacco: quit many years ago  Alcohol: a couple glasses of wine on weekends  Recreational Drugs: no    Review of Systems:  Fourteen point review of systems negative except as described in HPI.    Objective:     Vitals: There were no vitals taken for this visit.     General: In no acute distress  Head: Normocephalic, atraumatic. External ears and nose normal.   Eyes: Normal sclerae and conjunctivae bilaterally.  Pulmonary: No accessory muscle use or respiratory distress.  Psych: Normal mood with congruent affect.    Mental status: Alert and oriented. Speech clear and fluent without paraphasias. Answers questions appropriately. Fund of knowledge good.   Cranial nerves: Gaze conjugate, no nystagmus. Face symmetrical with facial movements preserved. Hearing grossly intact.  Motor: No adventitious movements.   Coordination: Grossly intact.  Gait/Station: Normal casual gait.    Studies:  04/23/18 MRI brain w+wo contrast  No infarct, intracranial hemorrhage, mass effect or abnormal enhancement.    09/22/2020 Routine EEG  Normal EEG in the awake and drowsy states.    11/15/2020 MRI brain w+wo contrast epilepsy  No structural abnormality is identified to explain epilepsy. No significant neural change since prior exam. There is a small developmental venous anomaly in the right superior frontal lobe. There is no abnormal parenchymal or leptomeningeal enhancement.    Assessment:     Joseph Donovan is a 66 y.o. left-handed (taught to use right hand) male with a history of epilepsy, gout, high cholesterol, and asthma, presenting for medication management. 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 He has remained seizure-free for >35-40 years, currently on Depakote ER 250 mg BID but experiencing a tremor that has improved with reducing Depakote dose but still is somewhat impacting the patient. We discussed the risks and benefits of medication taper and advised the patient that he should refrain from driving during the taper and for 3 months after stopping therapy.    Plan/Recommendation:     - Taper Depakote ER from 250 mg BID to 250 mg at bedtime x 14 days, then 250 mg at bedtime every other day x 14 days, then stop  - Advised patient that he  should not drive during the medication taper and for 3 months after  - Reviewed seizure precautions    Return in about 3 months (around 07/25/2021).    Patient and plan discussed with attending, Dr. Rennis Chris.  Author:  Velva Harman, MD     Contemporaneous to this patient visit, I have discussed the history, physical exam and medical decision making with the fellow.  I was immediately available for the key portions of this service.    Volney Presser.

## 2021-05-07 MED ORDER — ALLOPURINOL 300 MG PO TABS
300 mg | ORAL_TABLET | Freq: Every day | ORAL | 3 refills
Start: 2021-05-07 — End: ?

## 2021-05-07 MED ORDER — SIMVASTATIN 20 MG PO TABS
20 mg | ORAL_TABLET | Freq: Every evening | ORAL | 3 refills
Start: 2021-05-07 — End: ?

## 2021-05-10 MED ORDER — ALLOPURINOL 300 MG PO TABS
300 mg | ORAL_TABLET | Freq: Every day | ORAL | 2 refills | Status: AC
Start: 2021-05-10 — End: ?

## 2021-05-10 MED ORDER — SIMVASTATIN 20 MG PO TABS
20 mg | ORAL_TABLET | Freq: Every evening | ORAL | 2 refills | Status: AC
Start: 2021-05-10 — End: ?

## 2021-05-17 MED ORDER — DIVALPROEX SODIUM ER 250 MG PO TB24
ORAL_TABLET | 0 refills | Status: AC
Start: 2021-05-17 — End: 2021-05-27

## 2021-05-26 ENCOUNTER — Ambulatory Visit: Payer: BLUE CROSS/BLUE SHIELD

## 2021-05-26 DIAGNOSIS — Z1159 Encounter for screening for other viral diseases: Secondary | ICD-10-CM

## 2021-05-26 DIAGNOSIS — Z0000 Encounter for general adult medical examination without abnormal findings: Secondary | ICD-10-CM

## 2021-05-26 DIAGNOSIS — J4521 Mild intermittent asthma with (acute) exacerbation: Secondary | ICD-10-CM

## 2021-05-26 DIAGNOSIS — I10 Essential (primary) hypertension: Secondary | ICD-10-CM

## 2021-05-26 MED ORDER — LOSARTAN POTASSIUM 50 MG PO TABS
50 mg | ORAL_TABLET | Freq: Every day | ORAL | 3 refills | Status: AC
Start: 2021-05-26 — End: ?

## 2021-05-26 MED ORDER — FLUTICASONE-SALMETEROL 115-21 MCG/ACT IN AERO
2 | Freq: Two times a day (BID) | RESPIRATORY_TRACT | 3 refills | Status: AC
Start: 2021-05-26 — End: ?

## 2021-05-26 NOTE — Patient Instructions
Increase losartan to 50 mg daily.   Continue with blood pressure log.   Send it to me in 2-3 weeks after increase of your medication.   Hold the medication if your sbp< 110    Repeat electrolytes lab before your visit in 3 months.   Your annual exam fasting lab too.

## 2021-05-26 NOTE — Progress Notes
Internal Medicine NOTE    PATIENT: Joseph Donovan  MRN: [id number]  DOB: [birth year]  DATE OF SERVICE: 05/26/2021  Garret Reddish., MD    HPI   Joseph Donovan is a 66 y.o. male  has a past medical history of Cyst of kidney, acquired, Epilepsy (HCC/RAF), Gout of wrist (08/04/2014), colonic polyp, Hyperlipidemia, IBS (irritable bowel syndrome), Macrocytic anemia (07/15/2020), Obstructive sleep apnea (12/14/2019), Prediabetes, Seasonal allergies, and Skin cancer. presents for   Chief Complaint   Patient presents with   ? Follow-up   ? Asthma   ? Hypertension       Here to follow up on hypertension.   Started on losartan 25 mg po every day  Home blood pressure around 140-150s/ 80-90s  Machine validated last visit.     [place] and would like 3 months supply of advair.     Off the depakote as of Feb.   Currently no recurrent of sz.              PAST HISTORY         Past Medical History:      Past Medical History:   Diagnosis Date   ? Cyst of kidney, acquired    ? Epilepsy (HCC/RAF)    ? Gout of wrist 08/04/2014    Last Assessment & Plan:  History of. No recent flare up per patient. Counseled on diet modification. Increase water intake. Check uric acid level. Last Assessment & Plan:  Has been doing well on allopurinol without improvement   ? Hx of colonic polyp    ? Hyperlipidemia    ? IBS (irritable bowel syndrome)     diarrhea   ? Macrocytic anemia 07/15/2020   ? Obstructive sleep apnea 12/14/2019   ? Prediabetes    ? Seasonal allergies    ? Skin cancer        Past Surgical History:      Past Surgical History:   Procedure Laterality Date   ? COLONOSCOPY  2020    Normal colo at [facility]. Needs to bring in pathology report. Had polyps   ? TONSILLECTOMY         Family History:   Family History   Problem Relation Age of Onset   ? Depression Mother    ? Meniere's disease Mother    ? Brain cancer Father 76        Social History:    reports that he quit smoking about 14 years ago. His smoking use included cigarettes. He has a 15.00 pack-year smoking history. He has never used smokeless tobacco. He reports current alcohol use of about 2.4 oz per week. He reports that he does not currently use drugs.  Social History     Social History Narrative    Married with step daughter.         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     Has been working from home.         Allergy:  No Known Allergies     MEDS     Medications that the patient states to be currently taking   Medication Sig   ? albuterol 90 mcg/act inhaler Inhale 1 puff every four (4) hours as needed (shortness of breath  or wheezing).   ? allopurinol 300 mg tablet Take 1 tablet (300 mg total) by mouth daily.   ? cyanocobalamin 1000 mcg tablet Take 1 tablet (1,000 mcg total) by mouth.   ? fish oil 1000 mg capsule Take by mouth.   ? losartan 50 mg tablet Take 1 tablet (50 mg total) by mouth daily.   ? multivitamin tablet Take by mouth daily.   ? simvastatin 20 mg tablet Take 1 tablet (20 mg total) by mouth at bedtime.   ? [DISCONTINUED] fluticasone-salmeterol 115-21 mcg/act inhaler Inhale 2 puffs two (2) times daily Rinse mouth after each use.   ? [DISCONTINUED] losartan 25 mg tablet Take 1 tablet (25 mg total) by mouth daily.         REVIEW OF SYSTEMS   []  10 point ROS negative: pertinent negatives and positives noted in HPI   Review of Systems   Constitutional: Negative.    HENT: Negative.    Eyes: Negative.    Respiratory: Negative.    Cardiovascular: Negative.    Gastrointestinal: Negative.    Genitourinary: Negative.    Musculoskeletal: Negative.    Skin: Negative.    Neurological: Negative.    Endo/Heme/Allergies: Negative.    Psychiatric/Behavioral: Negative.             BP 129/71  ~ Pulse 76  ~ Temp 36.6 ?C (97.8 ?F) (Forehead)  ~ Resp 16  ~ Ht 6' 1'' (1.854 m)  ~ Wt (!) 244 lb 3.2 oz (110.8 kg)  ~ SpO2 96%  ~ BMI 32.22 kg/m? Repeated blood pressure 148/ 80    Physical Exam  Constitutional:       General: He is not in acute distress.  HENT:      Head: Normocephalic and atraumatic.   Cardiovascular:      Rate and Rhythm: Normal rate and regular rhythm.      Pulses: Normal pulses.      Heart sounds: Normal heart sounds. No murmur heard.    No gallop.   Pulmonary:      Effort: Pulmonary effort is normal.      Breath sounds: Normal breath sounds. No decreased breath sounds, wheezing, rhonchi or rales.   Abdominal:      General: Bowel sounds are normal.   Skin:     General: Skin is warm.   Neurological:      Mental Status: He is alert and oriented to person, place, and time.   Psychiatric:         Mood and Affect: Mood and affect normal.         REVIEW OF DATA   I have   [x]  Reviewed/ordered []  1 [x]  2 []  ? 3 unique laboratory, radiology, and/or diagnostic tests noted below    []  Reviewed []  1 []  2 []  ? 3 prior external notes and incorporated into patient assessment    []  Discussed management or test interpretation with external provider(s) as noted      Lab Studies:       LABS:  Reviewed today    Lab Results   Component Value Date    CREAT 0.97 05/23/2021    BUN 16 05/23/2021    NA 139 05/23/2021    K 4.8 05/23/2021    CL 103 05/23/2021    CO2 27 05/23/2021            ASSESSMENT & PLAN   Joseph Donovan is a 66 y.o. male presents for   Chief Complaint  Patient presents with   ? Follow-up   ? Asthma   ? Hypertension       DIAGNOSES/ORDERS  No diagnosis found.    Diagnoses and all orders for this visit:    Hypertension, essential  Home blood pressure 140s-150s/ 80-90s  Machine validated last visit.   Not ideal   Will increase losartan to 50 mg po every day  Blood pressure log in 2-3 weeks.   Consider adding hydrochlorothiazide or amlodipine if still not ideal    -     losartan 50 mg tablet; Take 1 tablet (50 mg total) by mouth daily.  - check lab next visit     Mild intermittent asthma with exacerbation  Stable.   Refill medication.   - fluticasone-salmeterol 115-21 mcg/act inhaler; Inhale 2 puffs two (2) times daily Rinse mouth after each use.    Routine health maintenance   -     Lipid Panel; Future; Expected date: 05/26/2021  -     Hgb A1c; Future; Expected date: 05/26/2021  -     Comprehensive Metabolic Panel; Future; Expected date: 05/26/2021  -     CBC; Future; Expected date: 05/26/2021  -     PSA,Screening; Future; Expected date: 05/26/2021  -     TSH with reflex FT4, FT3; Future; Expected date: 05/26/2021  -     Urinalysis w/Reflex to Culture; Future; Expected date: 05/26/2021    Need for hepatitis B screening test  -     HBS Antigen; Future             SOCIAL DETERMINANTS OF HEALTH    []  The diagnosis or treatment of said conditions is significantly limited by social determinants of health      Return in about 3 months (around 08/26/2021) for lab 1 week before office visit, annual physical.             Medical Decision Making addressed on 05/26/2021    :      Number and Complexity of Problems Addressed at the Encounter:   []   2 or more self-limited or minor problems     []   1 stable chronic illness  []   1 acute, uncomplicated illness or injury   [x]   1 or more chronic illness with exacerbation, progression, or side effects of treatment  []   2 or more stable chronic illness  []   1 undiagnosed new problem with uncertain diagnosis  []   1 acute illness with systemic symptoms  []   1 acute complicated injury   Review of Data: I have  [x]  Reviewed/ordered ? 2 unique laboratory, radiology, and/or diagnostic tests noted    []  Reviewed/ordered ? 3 unique laboratory, radiology, and/or diagnostic tests noted    []  Reviewed prior external notes and incorporated into patient assessment as noted  []  I have independently interpreted test performed by other physician(s) as noted   []  Discussed management or test interpretation with external provider(s) as noted     Risk of Complication and or Morbidity or Mortality of Patient Management including Social Determinants of Health:   []   I deem the above diagnoses to have a risk of complication, morbidity or mortality of Low  [x]   I deem the above diagnoses to have a risk of complication, morbidity or mortality of Moderate   []  The diagnosis or treatment of said conditions is significantly limited by social determinants of health as noted.          The above recommendation were discussed  with the patient. The patient has all questions answered satisfactorily and is in agreement with this recommended plan of care.      Author:  Wayne Both. Yadriel Kerrigan 05/26/2021 10:49 AM

## 2021-06-09 ENCOUNTER — Ambulatory Visit: Payer: BLUE CROSS/BLUE SHIELD

## 2021-06-09 DIAGNOSIS — I10 Essential (primary) hypertension: Secondary | ICD-10-CM

## 2021-06-09 NOTE — Telephone Encounter
BP log scanned in CC.

## 2021-06-10 MED ORDER — HYDROCHLOROTHIAZIDE 12.5 MG PO TABS
12.5 mg | ORAL_TABLET | Freq: Every day | ORAL | 2 refills | Status: AC
Start: 2021-06-10 — End: ?

## 2021-06-30 ENCOUNTER — Ambulatory Visit: Payer: BLUE CROSS/BLUE SHIELD

## 2021-06-30 NOTE — Telephone Encounter
BP log scanned in CC.

## 2021-07-21 ENCOUNTER — Ambulatory Visit: Payer: BLUE CROSS/BLUE SHIELD

## 2021-07-26 ENCOUNTER — Ambulatory Visit: Payer: BLUE CROSS/BLUE SHIELD

## 2021-07-27 ENCOUNTER — Ambulatory Visit: Payer: BLUE CROSS/BLUE SHIELD | Attending: Student in an Organized Health Care Education/Training Program

## 2021-08-24 ENCOUNTER — Ambulatory Visit: Payer: BLUE CROSS/BLUE SHIELD | Attending: Student in an Organized Health Care Education/Training Program

## 2021-08-29 ENCOUNTER — Ambulatory Visit: Payer: BLUE CROSS/BLUE SHIELD

## 2021-08-31 ENCOUNTER — Telehealth: Payer: BLUE CROSS/BLUE SHIELD | Attending: Student in an Organized Health Care Education/Training Program

## 2021-08-31 DIAGNOSIS — G40309 Generalized idiopathic epilepsy and epileptic syndromes, not intractable, without status epilepticus: Secondary | ICD-10-CM

## 2021-08-31 NOTE — Progress Notes
EPILEPSY CLINIC CONSULT NOTE    PATIENT:  Joseph Donovan  MRN:  [id number]  DOB:  1956/02/18  DATE OF SERVICE:  08/31/2021    REFERRING PHYSICIAN: Garret Reddish., MD  CHIEF COMPLAINT/REASON FOR CONSULT: medication management    Subjective:     History of Present Illness (09/05/2020):  Joseph Donovan is a 66 y.o. left-handed (taught to use right hand) male with a history of epilepsy, gout, high cholesterol, and asthma, presenting for medication management.    The patient states that he began having seizures at age 50. He was started on a barbiturate (thinks it was phenobarbital) and later switched to depakote 500/1000. His seizures became nocturnal after a couple years (noticed in the morning by the presence of severe headache) and then went away completely. He is not sure exactly when his last seizure was, but does not think he had any after starting university. His seizures are described as LOC with foaming at the mouth, tongue biting, and shaking in all extremities, consistent with tonic-clonic seizures. He vaguely remembers having a sensation occurring 1-2 minutes before some seizures, but does not remember what it was. He is not aware of being diagnosed with any specific type of epilepsy. He has had EEG in the distant past and does not know the findings. Had recent MRI in 2020 that was normal. There have been no events concerning for nocturnal seizures.    He denies history of staring spells and morning myoclonus. States he always did pretty well in school. He is currently on depakote 250/500 and tolerating well except for slight bilateral hand tremor. He previously thought about decreasing meds but did not due to not wanting to interfere with work. He is now nearing retirement and would like to avoid the SE of Depakote.    Interval History 04/27/2021:  Patient has not had any seizures for 35-40 years. Patient is still taking Depakote ER 250 mg BID. He continues to experience a slight tremor in his hands that improved with the decrease in Depakote dose, however the tremor continues to impact his function a bit, worse when he is holding objects (he spills when holding a glass of water).    Interval History 08/31/2021:  Patient reports no interval seizures, stopped Depakote in early 05/2021. He stopped driving for 3 months following medication taper without issue and restarted driving a few days ago.    Seizure Semiology:  LOC with shaking in extremities, +tongue biting. Often nocturnal. May have had a warning with seizures out of wakefulness, but details are not remembered (may be a smell or taste).  Began in 45 and was over by university.    Past Medical History:  Past Medical History:   Diagnosis Date   ? Cyst of kidney, acquired    ? Epilepsy (HCC/RAF)    ? Gout of wrist 08/04/2014    Last Assessment & Plan:  History of. No recent flare up per patient. Counseled on diet modification. Increase water intake. Check uric acid level. Last Assessment & Plan:  Has been doing well on allopurinol without improvement   ? Hx of colonic polyp    ? Hyperlipidemia    ? IBS (irritable bowel syndrome)     diarrhea   ? Macrocytic anemia 07/15/2020   ? Obstructive sleep apnea 12/14/2019   ? Prediabetes    ? Seasonal allergies    ? Skin cancer      Past Surgical History:   Procedure Laterality Date   ?  COLONOSCOPY  2020    Normal colo at [facility]. Needs to bring in pathology report. Had polyps   ? TONSILLECTOMY       No Known Allergies    Current Outpatient Medications   Medication Instructions   ? albuterol 90 mcg/act inhaler 1 puff, Inhalation, Every 4 hours PRN   ? allopurinol (ZYLOPRIM) 300 mg, Oral, Daily   ? cyanocobalamin (CYANOCOBALAMIN) 1,000 mcg, Oral   ? fish oil 1 g, Oral, Daily   ? fluticasone-salmeterol 115-21 mcg/act inhaler 2 puffs, Inhalation, 2 times daily, Rinse mouth after each use   ? hydroCHLOROthiazide (HYDRODIURIL) 12.5 mg, Oral, Daily   ? losartan (COZAAR) 50 mg, Oral, Daily   ? multivitamin tablet Oral, Daily   ? simvastatin (ZOCOR) 20 mg, Oral, Every night at bedtime     Seizure Risk Factors:  Birth/Development: unremarkable  History of febrile seizures: no  History of CNS infection: no  History of stroke: no  History of head trauma: no    NES Risk Factors:  Psychiatric comorbidities: not addressed  History of physical, sexual, or emotional abuse: not addressed    Current Anti-Seizure Medications: None    Prior/Failed Anti-Seizure Medications: Phenobarbital, Depakote ER 250 mg BID (successfully controlled seizures for 35-40 years, tapered off due to sustained seizure freedom)    Family History:  Father passed away of a brain tumor. Paternal uncle and his son may have had brain tumor. Relative on father's side may have had seizure. Brother may have had 1-2 seizures.    Social History:  Works at [employer] in [employer]. Lives with wife. Currently driving. Born in [place].  Tobacco: quit many years ago  Alcohol: a couple glasses of wine on weekends  Recreational Drugs: no  Driving: currently driving    Review of Systems:  Fourteen point review of systems negative except as described in HPI.    Objective:     Vitals: There were no vitals taken for this visit. Video visit.    General: In no acute distress  Head: Normocephalic, atraumatic. External ears and nose normal.   Eyes: Normal sclerae and conjunctivae bilaterally.  Pulmonary: No accessory muscle use or respiratory distress.  Psych: Normal mood with congruent affect.    Mental status: Alert and oriented. Speech clear and fluent without paraphasias. Answers questions appropriately. Fund of knowledge good.   Cranial nerves: Gaze conjugate, no nystagmus. Face symmetrical with facial movements preserved. Hearing grossly intact.  Motor: No adventitious movements.   Coordination: Grossly intact.  Gait/Station: Normal casual gait.    Studies:  04/23/18 MRI brain w+wo contrast  No infarct, intracranial hemorrhage, mass effect or abnormal enhancement.    09/22/2020 Routine EEG  Normal EEG in the awake and drowsy states.    11/15/2020 MRI brain w+wo contrast epilepsy  No structural abnormality is identified to explain epilepsy. No significant neural change since prior exam. There is a small developmental venous anomaly in the right superior frontal lobe. There is no abnormal parenchymal or leptomeningeal enhancement.    Assessment:     ALVAH LAGROW is a 66 y.o. left-handed (taught to use right hand) male with a history of epilepsy, gout, high cholesterol, and asthma, presenting for medication management. 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 He has remained seizure-free for >35-40 years, previously on Depakote ER 250 mg BID and was experiencing  a tremor that has improved with reducing Depakote dose. We had discussed the risks and benefits of medication taper and advised the patient that he should refrain from driving during the taper and for 3 months after stopping therapy. He has remained seizure-free since tapering off Depakote in early 05/2021.    Plan/Recommendation:     - Continue to monitor off anti-seizure medications  - Patient is cleared to continue driving, advised to be cautious if he is not feeling well or has not slept  - Reviewed seizure precautions    Return in about 6 months (around 03/02/2022). Transition to NP clinic for well-controlled epilepsy if he remains seizure-free at next visit.    Patient and plan discussed with attending, Dr. Rennis Chris.  Author:  Velva Harman, MD

## 2021-09-12 ENCOUNTER — Ambulatory Visit: Payer: BLUE CROSS/BLUE SHIELD

## 2021-09-13 NOTE — Telephone Encounter
Nothing further to do.

## 2021-09-21 ENCOUNTER — Ambulatory Visit: Payer: BLUE CROSS/BLUE SHIELD

## 2021-09-21 DIAGNOSIS — G40309 Generalized idiopathic epilepsy and epileptic syndromes, not intractable, without status epilepticus: Secondary | ICD-10-CM

## 2021-09-21 DIAGNOSIS — Z0000 Encounter for general adult medical examination without abnormal findings: Secondary | ICD-10-CM

## 2021-09-21 DIAGNOSIS — N401 Enlarged prostate with lower urinary tract symptoms: Secondary | ICD-10-CM

## 2021-09-21 DIAGNOSIS — J4521 Mild intermittent asthma with (acute) exacerbation: Secondary | ICD-10-CM

## 2021-09-21 DIAGNOSIS — I10 Essential (primary) hypertension: Secondary | ICD-10-CM

## 2021-09-21 DIAGNOSIS — E785 Hyperlipidemia, unspecified: Secondary | ICD-10-CM

## 2021-09-21 MED ORDER — TAMSULOSIN HCL 0.4 MG PO CAPS
.4 mg | ORAL_CAPSULE | Freq: Every evening | ORAL | 1 refills | Status: AC
Start: 2021-09-21 — End: ?

## 2021-09-21 MED ORDER — LOSARTAN POTASSIUM-HCTZ 50-12.5 MG PO TABS
1 | ORAL_TABLET | Freq: Every day | ORAL | 3 refills | Status: AC
Start: 2021-09-21 — End: ?

## 2021-09-21 NOTE — Progress Notes
Patient: Joseph Donovan  MRN: [id number]  Date of Service: 09/21/2021  PCP : Garret Reddish., MD      CC:   Chief Complaint   Patient presents with   ? Annual Exam     History of Present Illness:     Joseph Donovan is a 66 y.o. male with  has a past medical history of Asthma (Recur this year), Chicken pox, Cyst of kidney, acquired, Epilepsy (HCC/RAF), Gout of wrist (08/04/2014), colonic polyp, Hyperlipidemia, IBS (irritable bowel syndrome), Macrocytic anemia (07/15/2020), Obstructive sleep apnea (12/14/2019), Peptic ulcer disease, Prediabetes, Seasonal allergies, and Skin cancer. presented annual exam and []  to discuss acute and/or chronic medical issues      Last visit 3/ 2023  Lab done recently     Hydrochlorothiazide 12.5 mg added to losartan 50 mg.   Tolerating medication.   Would like to have combo pills.     Has noted increase urinary frequency and bph symptoms.      off antisz medication now and driving okay.   Noted his tremor had improved off the medication.     + 33 lbs weight loss due to lifestyle modification.     Retiring tomorrow.   Will be going to [place] next months.       Preventive Health Assessment:     Exercise is  adequate.    Seatbelts are used at all times.  [employer] Link protection is used.        Health Maintenance   Topic Date Due   ? Annual Preventive Wellness Visit  07/15/2021   ? Colorectal Cancer Screening  10/09/2023   ? Prediabetes Screening (See hover text)  09/15/2024   ? Tdap/Td Vaccine (3 - Td or Tdap) 10/16/2026   ? Hepatitis B Screening  Completed   ? Influenza Vaccine  Completed   ? Hepatitis C Screening  Completed   ? Shingles (Shingrix) Vaccine  Completed   ? COVID-19 Vaccine(Tracks primary and booster doses, not sup/immunocomp)  Completed   ? HIV Screening  Completed             Prob List       PMH     Past Medical History:   Diagnosis Date   ? Asthma Recur this year    Childhood then stopped. Recurred this year   ? Chicken pox    ? Cyst of kidney, acquired    ? Epilepsy (HCC/RAF)    ? Gout of wrist 08/04/2014    Last Assessment & Plan:  History of. No recent flare up per patient. Counseled on diet modification. Increase water intake. Check uric acid level. Last Assessment & Plan:  Has been doing well on allopurinol without improvement   ? Hx of colonic polyp    ? Hyperlipidemia    ? IBS (irritable bowel syndrome)     diarrhea   ? Macrocytic anemia 07/15/2020   ? Obstructive sleep apnea 12/14/2019   ? Peptic ulcer disease    ? Prediabetes    ? Seasonal allergies    ? Skin cancer      PSxH     Past Surgical History:   Procedure Laterality Date   ? COLONOSCOPY  2020    Normal colo at [facility]. Needs to bring in pathology report. Had polyps   ? EYE SURGERY      Lasik   ? TONSILLECTOMY       ALL   No Known Allergies  MEDS     Medications  that the patient states to be currently taking   Medication Sig   ? albuterol 90 mcg/act inhaler Inhale 1 puff every four (4) hours as needed (shortness of breath or wheezing).   ? allopurinol 300 mg tablet Take 1 tablet (300 mg total) by mouth daily.   ? cyanocobalamin 1000 mcg tablet Take 1 tablet (1,000 mcg total) by mouth.   ? fish oil 1000 mg capsule Take 1 capsule (1 g total) by mouth daily.   ? fluticasone-salmeterol 115-21 mcg/act inhaler Inhale 2 puffs two (2) times daily Rinse mouth after each use.   ? multivitamin tablet Take by mouth daily.   ? simvastatin 20 mg tablet Take 1 tablet (20 mg total) by mouth at bedtime.   ? [DISCONTINUED] hydroCHLOROthiazide 12.5 MG tablet Take 1 tablet (12.5 mg total) by mouth daily.   ? [DISCONTINUED] losartan 50 mg tablet Take 1 tablet (50 mg total) by mouth daily.     [facility]     Family History   Problem Relation Age of Onset   ? Depression Mother    ? Meniere's disease Mother    ? Brain cancer Father 10   ? Cancer Father         Brain tumor (fatal)          Social History:    reports that he quit smoking about 14 years ago. His smoking use included cigarettes. He has a 15.00 pack-year smoking history. He has never used smokeless tobacco. He reports current alcohol use of about 2.4 oz of alcohol per week. He reports that he does not currently use drugs.  Social History     Social History Narrative    Married with step daughter.         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     Will be retiring.             REVIEW OF SYSTEMS   []  10 point ROS negative: pertinent negatives and positives noted in HPI      Review of Systems   Constitutional: Negative.    HENT: Negative.    Eyes: Negative.    Respiratory: Negative.    Cardiovascular: Negative.    Gastrointestinal: Negative.    Genitourinary: Negative.    Musculoskeletal: Negative.    Skin: Negative.    Neurological: Negative.    Endo/Heme/Allergies: Negative.    Psychiatric/Behavioral: Negative.              Physical Exam:     BP 126/65  ~ Pulse 76  ~ Temp 36.4 ?C (97.5 ?F) (Forehead)  ~ Resp 16  ~ Ht 6' 1'' (1.854 m)  ~ Wt 211 lb (95.7 kg)  ~ SpO2 97%  ~ BMI 27.84 kg/m?       Physical Exam  Constitutional:       General: He is not in acute distress.  HENT:      Head: Normocephalic and atraumatic.   Cardiovascular:      Rate and Rhythm: Normal rate and regular rhythm.      Pulses: Normal pulses.      Heart sounds: Normal heart sounds. No murmur heard.     No gallop.   Pulmonary:      Effort: Pulmonary effort is normal.  Breath sounds: Normal breath sounds. No decreased breath sounds, wheezing, rhonchi or rales.   Abdominal:      General: Bowel sounds are normal.      Palpations: Abdomen is soft.      Tenderness: There is no abdominal tenderness.      Hernia: No hernia is present.   Musculoskeletal:      Cervical back: Normal.      Thoracic back: Normal.      Lumbar back: Normal.   Lymphadenopathy:      Cervical: No cervical adenopathy.   Skin:     General: Skin is warm.   Neurological:      Mental Status: He is alert and oriented to person, place, and time.      Cranial Nerves: Cranial nerves 2-12 are intact.      Sensory: Sensation is intact.      Motor: Motor function is intact.      Coordination: Coordination is intact.      Gait: Gait is intact.      Comments: + intermittent hand tremor, not new.    Psychiatric:         Mood and Affect: Mood and affect normal.         Speech: Speech normal.          LABS:    Lab Results   Component Value Date    HGBA1C 5.9 (H) 09/15/2021       No results found for: ''[facility]''  Lab Results   Component Value Date    WBC 5.44 09/15/2021    HGB 13.2 (L) 09/15/2021    HCT 39.9 09/15/2021    MCV 102.3 (H) 09/15/2021    PLT 294 09/15/2021     Lab Results   Component Value Date    CREAT 1.13 09/15/2021    BUN 19 09/15/2021    NA 140 09/15/2021    K 4.5 09/15/2021    CL 104 09/15/2021    CO2 26 09/15/2021     Lab Results   Component Value Date    HGBA1C 5.9 (H) 09/15/2021     Lab Results   Component Value Date    ALT 21 09/15/2021    AST 24 09/15/2021    ALKPHOS 59 09/15/2021    BILITOT 0.9 09/15/2021     Lab Results   Component Value Date    TSH 2.7 09/15/2021     Lab Results   Component Value Date    CALCIUM 9.6 09/15/2021     Lab Results   Component Value Date    CHOL 128 09/15/2021    CHOLHDL 54 09/15/2021    CHOLDLCAL 60 09/15/2021    TRIGLY 70 09/15/2021     No results found for: ''INR'', ''INRPOC'', ''PT''    I have   [] reviewed radiology,  [] reviewed labs, [] reviewed diag med test, [] reviewed & summarized old records, [] requested outside medical records.      REVIEW OF DATA   I have   []  Reviewed/ordered []  1 []  2 []  ? 3 unique laboratory, radiology, and/or diagnostic tests noted above    []  Reviewed []  1 []  2 []  ? 3 prior external notes and incorporated into patient assessment    []  Discussed management or test interpretation with external provider(s) as noted         Imaging Studies:  reviewed by me       MR brain wo+w contrast epilepsy    Result Date: 11/16/2020  IMPRESSION: No structural  abnormality is identified to explain epilepsy. No significant neural change since prior exam. Postcontrast images are not available at the time of interpretation. An addendum will be added after receiving images. Signed by: Trudi Ida   11/15/2020 6:32 PM      CHART REVIEW       I reviewed these specialist notes that directly relate to the patient's acute and/or chronic medical problems with documentation of the salient findings, if ZOX:WRUEA Orlean Patten, MD in Neurology, Epilepsy on 08/31/2021.}     IMPRESSION/PLAN:       Diagnoses and all orders for this visit:    Encounter for annual physical exam  PHQ 2 = 0   Colonoscopy =  Up to date  Encourage healthy diet and routine exercise.  Seatbelt, sunscreen and dental care discussed.    Results discussed in details  Questions answered.      Hypertension, essential  Improved   Will combine losartan-hydrochlorothiazide  Normal electrolytes.    -     losartan-hydroCHLOROthiazide 50-12.5 mg tablet; Take 1 tablet by mouth daily.    Benign prostatic hyperplasia with lower urinary tract symptoms, symptom details unspecified  Normal psa.   Symptoms consistent with bph.   Trial of tamsulosin.     -     tamsulosin 0.4 mg capsule; Take 1 capsule (0.4 mg total) by mouth at bedtime.    Mild intermittent asthma with exacerbation  Stable   Continue with fluticasone-salmeterol 2 puffs bid.   Albuterol prn     Hyperlipidemia, unspecified hyperlipidemia type  Good cholesterol profile.    Continue with simvastatin 20 mg po at bedtime  Normal liver function     Macrocytic anemia  Improved.   Low normal b12.   Continue with b12 supplement.   Continue to trend.     Prediabetes  Improved to 5.9 with weight loss.   Life style modification with exercise and healthy diet.     Tremor of both hands  Improved after off anti seizure medication.     Low vitamin B12 level  Daily b12 1000 mcg po every day    Obstructive sleep apnea  Not on cpap. Was evaluated when back in [place].   At this time, will consider referral to sleep medicine for re-evaluation.   + significant weight loss.          Nonintractable generalized idiopathic epilepsy without status epilepticus (HCC/RAF)    Off anti-seizure medication.   Doing well.   Followed by neurology          Problem List Items Addressed This Visit     Epilepsy (HCC/RAF)    Relevant Medications    losartan-hydroCHLOROthiazide 50-12.5 mg tablet    Obstructive sleep apnea    Tremor of both hands    Macrocytic anemia    Prediabetes    Low vitamin B12 level    Hyperlipidemia    Relevant Medications    losartan-hydroCHLOROthiazide 50-12.5 mg tablet   Other Visit Diagnoses     Encounter for annual physical exam    -  Primary    Hypertension, essential        Relevant Medications    losartan-hydroCHLOROthiazide 50-12.5 mg tablet    Benign prostatic hyperplasia with lower urinary tract symptoms, symptom details unspecified        Relevant Medications    tamsulosin 0.4 mg capsule    Mild intermittent asthma with exacerbation              Discussed age-appropriate cancer and  health screenings, see HCM box  Discussed diet - focus on diet high in fruits/veggies/fish/legumes, low in carbs/processed foods  Discussed exercise   Discussed mental health   Reviewed and updated family and social histories   Medical issues as documented     Healthcare Maintenance  Health Maintenance   Topic Date Due   ? Annual Preventive Wellness Visit  07/15/2021   ? Colorectal Cancer Screening  10/09/2023   ? Prediabetes Screening (See hover text)  09/15/2024   ? Tdap/Td Vaccine (3 - Td or Tdap) 10/16/2026   ? Hepatitis B Screening  Completed   ? Influenza Vaccine  Completed   ? Hepatitis C Screening  Completed   ? Shingles (Shingrix) Vaccine  Completed   ? COVID-19 Vaccine(Tracks primary and booster doses, not sup/immunocomp)  Completed   ? HIV Screening  Completed       Immunizations   Immunization History   Administered Date(s) Administered   ? COVID-19, mRNA, (Moderna) 100 mcg/0.5 mL 06/19/2019, 07/17/2019, 01/28/2020, 06/25/2020   ? COVID-19, mRNA, bivalent (Moderna) 50 mcg/0.5 mL (12y and up) 12/02/2020   ? Influenza vaccine IM quadrivalent (Afluria Quad) (PF) SYR (45 years of age and older) 02/07/2015, 01/27/2016, 12/13/2016   ? Td 10/05/2003   ? Tdap 04/12/2009, 10/15/2016   ? Typhoid Inactivated, ViCPs 10/05/2003   ? influenza vaccine IM cell culture quadrivalent (Flucelvax QUAD) (PF) SYR (54 months of age and older) 12/11/2019   ? influenza vaccine IM quadrivalent (Fluarix Quad) (PF) SYR (57 months of age and older) 03/11/2018, 12/26/2018   ? influenza, unspecified formulation 02/07/2015, 12/31/2015, 01/27/2016, 12/13/2016, 12/02/2020   ? zoster vac recomb adjuvanted (Shingrix) 03/29/2018, 05/31/2018              RTC:  Return in about 6 months (around 03/23/2022) for 30 minutes follow up.    Today's visit lasted more than 30 minutes of which greater than 50% was spent in counseling, coordination of care and a detailed question and answer session regarding the pathophysiology, etiology, and treatment options available including the risks and benefits related to the medical issues pertinent to this encounter.  The date of encounter was when the patient was seen and does not reflect when the note was signed. The above plan of care, diagnosis, orders, and follow-up were discussed with the patient.  Questions related to this recommended plan of care were answered.          Keyonta Madrid S. Regino Schultze, MD 09/21/2021 3:44 PM     Assistant Clinical Professor  Internal Medicine  [facility]  [address], [address]  [place], [place] [address]  Office:[phone]

## 2021-09-21 NOTE — Patient Instructions
Please start the combo pill of losartan-hydrochlorothiazide 50-12.5 daily.  Stop the individual pills.       Start the tamsulosin 0.4 mg pill at night before bed for your BPH symptoms.

## 2021-09-26 ENCOUNTER — Ambulatory Visit: Payer: BLUE CROSS/BLUE SHIELD

## 2021-10-07 ENCOUNTER — Ambulatory Visit: Payer: BLUE CROSS/BLUE SHIELD

## 2021-10-07 DIAGNOSIS — I10 Essential (primary) hypertension: Secondary | ICD-10-CM

## 2021-10-07 MED ORDER — LOSARTAN POTASSIUM 50 MG PO TABS
50 mg | ORAL_TABLET | Freq: Every day | ORAL | 3 refills | Status: AC
Start: 2021-10-07 — End: ?

## 2021-10-10 ENCOUNTER — Telehealth: Payer: BLUE CROSS/BLUE SHIELD

## 2021-10-10 NOTE — Telephone Encounter
Spoke to Penfield from [employer] and confirmed that pt is no longer on      losartan-hydroCHLOROthiazide 50-12.5 mg tablet (Discontinued) 90 tablet 3 09/21/2021 10/07/2021 --   Sig: Take 1 tablet by mouth daily.   Sent to pharmacy as: losartan-hydroCHLOROthiazide 50-12.5 mg tablet   Route: Oral   E-Prescribing Status: Receipt confirmed by pharmacy (09/21/2021 2:40 PM PDT)   E-Cancel Status: Request approved by pharmacy (10/09/2021 12:45 PM PDT)       E-Cancel Status Note: Some or all of Rx dispensed. Last dispense date: 2021-09-22.     And now only taking  losartan 50 mg tablet 90 tablet 3 10/07/2021 10/07/2022 --   Sig: Take 1 tablet (50 mg total) by mouth daily.   Sent to pharmacy as: losartan 50 mg tablet   Route: Oral   E-Prescribing Status: Receipt confirmed by pharmacy (10/07/2021 9:09 AM PDT)

## 2021-10-10 NOTE — Telephone Encounter
Yes. Patient is now on losartan 50 mg daily.   Thanks.

## 2021-10-10 NOTE — Telephone Encounter
Medication Verification      Medication: losartan 50 mg tablet    Caller would like to verify the following (Please check all that applies):    '[]'$  Dosage    '[]'$  Quantity      '[]'$  Directions     Additional information?  Pharmacy would like to verify if this new prescription is replacing all the previous prescriptions for losartan    Patient or caller has been notified of the turnaround time of 1-2 business day(s).

## 2021-10-10 NOTE — Telephone Encounter
Disp Refills Start End DAW   losartan 50 mg tablet 90 tablet 3 10/07/2021 10/07/2022 --   Sig: Take 1 tablet (50 mg total) by mouth daily.   Sent to pharmacy as: losartan 50 mg tablet   Route: Oral   E-Prescribing Status: Receipt confirmed by pharmacy (10/07/2021 9:09 AM PDT)     losartan 25 mg tablet (Discontinued) 90 tablet 3 04/14/2021 04/16/2021 --   Sig: Take 1 tablet (25 mg total) by mouth daily.   Sent to pharmacy as: losartan 25 mg tablet   Route: Oral   Reason for Discontinue: Reorder   E-Prescribing Status: Receipt confirmed by pharmacy (04/14/2021 10:50 AM PST)

## 2021-12-10 NOTE — Progress Notes
Primary care progress note    PATIENT: Joseph Donovan  MRN: [id number]  DOB: [birth year]  DATE OF SERVICE: 12/10/2021  PRIMARY CARE PHYSICIAN: Garret Reddish., MD    Subjective:   CC:   WJX:BJYN is a 66 y.o. male w/ hx of HLD, IBS, low b12 level, macrocytosis without anemia, acquired cyst of kidney, alcohol use, prediabetes, OSA, epilepsy, skin cancer and tremors of both hands who presents for above.    Minor issues for a few months.  Get up in morning and have some trouble and sit down and pain goes away.  Minor during the day.  couple months ago, vacation to [place]-->became really bad.  Now discomfort in morning. Various points during day.  Some days minor.  If standing around -->have to keep sitting down. If walking more, bearable.  If stop and stand around, painful.   If pts sits down pain passes.   Sometimes it can just be numbness. Sometimes it is a burning sensation.   Not sharp pain. Sometimes aching pain. Pain goes down the back/side of the leg.  No weakness.  No lower back pain.  Been taking OTC aleve as needed.  Currently no pain.    Past Medical History:   Diagnosis Date   ? Asthma Recur this year    Childhood then stopped. Recurred this year   ? Chicken pox    ? Cyst of kidney, acquired    ? Epilepsy (HCC/RAF)    ? Gout of wrist 08/04/2014    Last Assessment & Plan:  History of. No recent flare up per patient. Counseled on diet modification. Increase water intake. Check uric acid level. Last Assessment & Plan:  Has been doing well on allopurinol without improvement   ? Hx of colonic polyp    ? Hyperlipidemia    ? IBS (irritable bowel syndrome)     diarrhea   ? Macrocytic anemia 07/15/2020   ? Obstructive sleep apnea 12/14/2019   ? Peptic ulcer disease    ? Prediabetes    ? Seasonal allergies    ? Skin cancer      Past Surgical History:   Procedure Laterality Date   ? COLONOSCOPY  2020    Normal colo at [facility]. Needs to bring in pathology report. Had polyps   ? EYE SURGERY      Lasik   ? TONSILLECTOMY       Family History   Problem Relation Age of Onset   ? Depression Mother    ? Meniere's disease Mother    ? Brain cancer Father 32   ? Cancer Father         Brain tumor (fatal)     Social History     Socioeconomic History   ? Marital status: Married   Tobacco Use   ? Smoking status: Former     Packs/day: 0.50     Years: 30.00     Additional pack years: 0.00     Total pack years: 15.00     Types: Cigarettes     Quit date: 03/27/2007     Years since quitting: 14.7   ? Smokeless tobacco: Never   Substance and Sexual Activity   ? Alcohol use: Yes     Alcohol/week: 2.4 oz     Types: 4 Glasses of Wine (5 oz) per week     Comment: socially   ? Drug use: Not Currently   ? Sexual activity: Yes     Partners: Female  Birth control/protection: Male Condom, Withdrawal / Coitus Interruptus   Other Topics Concern   ? Do you exercise at least a day, 3 or more days a week? Yes   ? Do you follow a special diet? No   ? Lactose Free? Yes   ? Types of Exercise? (List in Comments) Yes     Comment: Gym, hiking, cycling   Social History Narrative    Married with step daughter.         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     Will be retiring.        Current Outpatient Medications:   ?  albuterol 90 mcg/act inhaler, Inhale 1 puff every four (4) hours as needed (shortness of breath or wheezing)., Disp: 8.5 g, Rfl: 2  ?  allopurinol 300 mg tablet, Take 1 tablet (300 mg total) by mouth daily., Disp: 90 tablet, Rfl: 2  ?  cyanocobalamin 1000 mcg tablet, Take 1 tablet (1,000 mcg total) by mouth., Disp: , Rfl:   ?  fish oil 1000 mg capsule, Take 1 capsule (1 g total) by mouth daily., Disp: , Rfl:   ?  fluticasone-salmeterol 115-21 mcg/act inhaler, Inhale 2 puffs two (2) times daily Rinse mouth after each use., Disp: 36 g, Rfl: 3  ?  losartan 50 mg tablet, Take 1 tablet (50 mg total) by mouth daily., Disp: 90 tablet, Rfl: 3  ?  multivitamin tablet, Take by mouth daily., Disp: , Rfl:   ?  simvastatin 20 mg tablet, Take 1 tablet (20 mg total) by mouth at bedtime., Disp: 90 tablet, Rfl: 2  ?  tamsulosin 0.4 mg capsule, Take 1 capsule (0.4 mg total) by mouth at bedtime., Disp: 90 capsule, Rfl: 1  No Known Allergies  Objective:   BP 118/69 (BP Location: Right arm, Patient Position: Sitting, Cuff Size: Large)  ~ Pulse 75  ~ Temp 36.3 ?C (97.3 ?F) (Forehead)  ~ Resp 18  ~ Ht 6' 1'' (1.854 m)  ~ Wt 201 lb 12.8 oz (91.5 kg)  ~ SpO2 98%  ~ BMI 26.62 kg/m?   Physical Exam  Eyes:      General: No scleral icterus.     Conjunctiva/sclera: Conjunctivae normal.   Pulmonary:      Effort: Pulmonary effort is normal.   Musculoskeletal:      Comments: No pain on palpation of area of buttock where pain is.   Neurological:      Mental Status: He is alert.      Motor: No weakness.      Gait: Gait normal.   Psychiatric:         Mood and Affect: Mood and affect normal.         Labs/Imaging:  Lab Results   Component Value Date    WBC 5.44 09/15/2021    HGB 13.2 (L) 09/15/2021    HCT 39.9 09/15/2021    MCV 102.3 (H) 09/15/2021    PLT 294 09/15/2021     Lab Results   Component Value Date    NA 140 09/15/2021    K 4.5 09/15/2021    CL 104 09/15/2021    CO2 26 09/15/2021    BUN 19 09/15/2021    CREAT 1.13 09/15/2021  Lab Results   Component Value Date    ALT 21 09/15/2021    AST 24 09/15/2021    ALKPHOS 59 09/15/2021    BILITOT 0.9 09/15/2021     Lab Results   Component Value Date    TSH 2.7 09/15/2021    TSH 2.3 07/06/2020     Lab Results   Component Value Date    HGBA1C 5.9 (H) 09/15/2021    HGBA1C 6.3 (H) 01/02/2021       Assessment/plan     #chronic right leg pain  Possible piriformis syndrome based on location/distribution of pain. Atypical that it is worse with prolong standing.  For now will treat as piriformis syndrome:  -Naproxen take 500mg  twice a day around the clock for 4-5 days and as needed afterwards. If taking twice a day, do not go over two weeks.  -Cyclobenzaprine muscle relaxant. 5mg  three times a day for muscle spasms/tightness. If tolerated, can increase to dose of 10mg  instead of 5mg .  -home PT exercises link given that he can start in a week  -if not improving/worsening-->referral to sports med      [facility]  -flu shot today    The above plan of care, diagnosis, order, and follow-up were discussed with the patient. Questions related to this recommended plan of care were answered.    Future Appointments   Date Time Provider Department Center   12/11/2021  9:30 AM Aneka Fagerstrom, Isaac Laud., MD [facility] [place]   03/29/2022 10:00 AM Garret Reddish., MD [facility] Lizzete Gough 12/10/2021    PROBLEM -  Low complexity   1 acute uncomplicated illness was addressed during this encounter.    DATA COMPLEXITY - None         RISK - Moderate risk   Prescription drug management.

## 2021-12-11 ENCOUNTER — Ambulatory Visit: Payer: BLUE CROSS/BLUE SHIELD

## 2021-12-11 DIAGNOSIS — M79604 Pain in right leg: Secondary | ICD-10-CM

## 2021-12-11 DIAGNOSIS — Z23 Encounter for immunization: Secondary | ICD-10-CM

## 2021-12-11 MED ORDER — CYCLOBENZAPRINE HCL 5 MG PO TABS
5 mg | ORAL_TABLET | Freq: Three times a day (TID) | ORAL | 0 refills | Status: AC | PRN
Start: 2021-12-11 — End: 2021-12-20

## 2021-12-11 MED ORDER — NAPROXEN 500 MG PO TABS
500 mg | ORAL_TABLET | Freq: Two times a day (BID) | ORAL | 0 refills | Status: AC
Start: 2021-12-11 — End: ?

## 2021-12-11 NOTE — Patient Instructions
Possible right piriformis syndrome based on pain location and part of presentation.    Naproxen take 500mg  twice a day around the clock for 4-5 days and as needed afterwards. If taking twice a day, do not go over two weeks.    Cyclobenzaprine muscle relaxant. 5mg  three times a day for muscle spasms/tightness. If tolerated, can increase to dose of 10mg  instead of 5mg .    After about a week, can start on some home physical therapy exercises  [email or web address]    If you still have a problem, we can have you see sports medicine. Can request referral from me by mychart message.

## 2021-12-12 ENCOUNTER — Ambulatory Visit: Payer: PRIVATE HEALTH INSURANCE

## 2021-12-12 DIAGNOSIS — J4521 Mild intermittent asthma with (acute) exacerbation: Secondary | ICD-10-CM

## 2021-12-12 DIAGNOSIS — E785 Hyperlipidemia, unspecified: Secondary | ICD-10-CM

## 2021-12-12 DIAGNOSIS — I10 Essential (primary) hypertension: Secondary | ICD-10-CM

## 2021-12-12 DIAGNOSIS — M10031 Idiopathic gout, right wrist: Secondary | ICD-10-CM

## 2021-12-12 DIAGNOSIS — N401 Enlarged prostate with lower urinary tract symptoms: Secondary | ICD-10-CM

## 2021-12-12 DIAGNOSIS — Z85828 Personal history of other malignant neoplasm of skin: Secondary | ICD-10-CM

## 2021-12-12 DIAGNOSIS — G40309 Generalized idiopathic epilepsy and epileptic syndromes, not intractable, without status epilepticus: Secondary | ICD-10-CM

## 2021-12-12 DIAGNOSIS — Z1283 Encounter for screening for malignant neoplasm of skin: Secondary | ICD-10-CM

## 2021-12-12 MED ORDER — TAMSULOSIN HCL 0.4 MG PO CAPS
.4 mg | ORAL_CAPSULE | Freq: Every evening | ORAL | 0 refills | Status: AC
Start: 2021-12-12 — End: ?

## 2021-12-12 MED ORDER — SIMVASTATIN 20 MG PO TABS
20 mg | ORAL_TABLET | Freq: Every evening | ORAL | 0 refills | Status: AC
Start: 2021-12-12 — End: ?

## 2021-12-12 MED ORDER — FLUTICASONE-SALMETEROL 115-21 MCG/ACT IN AERO
2 | Freq: Two times a day (BID) | RESPIRATORY_TRACT | 0 refills | Status: AC
Start: 2021-12-12 — End: ?

## 2021-12-12 MED ORDER — LOSARTAN POTASSIUM 50 MG PO TABS
50 mg | ORAL_TABLET | Freq: Every day | ORAL | 0 refills | Status: AC
Start: 2021-12-12 — End: ?

## 2021-12-12 MED ORDER — ALLOPURINOL 300 MG PO TABS
300 mg | ORAL_TABLET | Freq: Every day | ORAL | 0 refills | Status: AC
Start: 2021-12-12 — End: ?

## 2021-12-12 MED ORDER — ALBUTEROL SULFATE HFA 108 (90 BASE) MCG/ACT IN AERS
1 | RESPIRATORY_TRACT | 0 refills | Status: AC | PRN
Start: 2021-12-12 — End: ?

## 2021-12-14 MED ORDER — ADVAIR HFA 115-21 MCG/ACT IN AERO
2 | Freq: Two times a day (BID) | RESPIRATORY_TRACT | 0 refills | Status: AC
Start: 2021-12-14 — End: ?

## 2021-12-19 ENCOUNTER — Ambulatory Visit: Payer: PRIVATE HEALTH INSURANCE

## 2021-12-20 MED ORDER — METHOCARBAMOL 500 MG PO TABS
500 mg | ORAL_TABLET | Freq: Four times a day (QID) | ORAL | 0 refills | Status: AC | PRN
Start: 2021-12-20 — End: ?

## 2021-12-25 ENCOUNTER — Ambulatory Visit: Payer: PRIVATE HEALTH INSURANCE

## 2021-12-25 DIAGNOSIS — G8929 Other chronic pain: Secondary | ICD-10-CM

## 2021-12-25 DIAGNOSIS — M79604 Pain in right leg: Secondary | ICD-10-CM

## 2021-12-26 ENCOUNTER — Ambulatory Visit: Payer: BLUE CROSS/BLUE SHIELD

## 2021-12-26 DIAGNOSIS — M79604 Pain in right leg: Secondary | ICD-10-CM

## 2021-12-28 ENCOUNTER — Ambulatory Visit: Payer: PRIVATE HEALTH INSURANCE

## 2021-12-29 ENCOUNTER — Telehealth: Payer: PRIVATE HEALTH INSURANCE

## 2021-12-29 NOTE — Telephone Encounter
lvm at [phone] asking pt to call back and r/s apt that was originally scheduled for 12/14 as apt was scheduled incorrectly, apt slot given was not available. Please schedule pt for next available 30 minute return slot

## 2022-01-01 MED ORDER — ADVAIR HFA 115-21 MCG/ACT IN AERO
11 refills
Start: 2022-01-01 — End: ?

## 2022-01-02 MED ORDER — ADVAIR HFA 115-21 MCG/ACT IN AERO
0 refills | Status: AC
Start: 2022-01-02 — End: ?

## 2022-01-03 ENCOUNTER — Ambulatory Visit: Payer: PRIVATE HEALTH INSURANCE

## 2022-01-03 ENCOUNTER — Ambulatory Visit: Payer: BLUE CROSS/BLUE SHIELD

## 2022-01-04 ENCOUNTER — Ambulatory Visit: Payer: BLUE CROSS/BLUE SHIELD

## 2022-01-04 MED ORDER — ALBUTEROL SULFATE HFA 108 (90 BASE) MCG/ACT IN AERS
1 | RESPIRATORY_TRACT | 1 refills | Status: AC | PRN
Start: 2022-01-04 — End: ?

## 2022-01-23 ENCOUNTER — Ambulatory Visit: Payer: PRIVATE HEALTH INSURANCE

## 2022-01-23 DIAGNOSIS — M5416 Radiculopathy, lumbar region: Secondary | ICD-10-CM

## 2022-01-23 DIAGNOSIS — I10 Essential (primary) hypertension: Secondary | ICD-10-CM

## 2022-01-23 NOTE — Progress Notes
[facility]    PATIENT: Joseph Donovan  MRN: [id number]  DOB: Jan 10, 1956  DATE OF SERVICE: 01/24/2022    PRIMARY CARE PROVIDER: Garret Reddish., MD  REFERRING PROVIDER: No ref. provider found    Chief Complaint   Patient presents with   ? Follow-up     Blood pressure medications      Patient Active Problem List   Diagnosis   ? Gout of wrist   ? Hyperlipidemia   ? IBS (irritable bowel syndrome)   ? Low vitamin B12 level   ? Macrocytosis without anemia   ? Snores   ? Cyst of kidney, acquired   ? BMI 31.0-31.9,adult   ? Reactive airway disease with wheezing   ? Macrocytic anemia   ? Alcohol use   ? Prediabetes   ? Tremor of both hands   ? Obstructive sleep apnea   ? Epilepsy (HCC/RAF)   ? Hx of colonic polyp   ? Seasonal allergies   ? Skin cancer       Subjective:   Joseph Donovan is a 66 y.o. male with above PMH who presents for:    #HTN  - on losartan and hydrochlorothiazide now, since stopped flomax  - needs refill  - reports BP well controlled    #Asthma  - thinks getting worse  - has been coughing more  - now using rescue inhaler multiple times a day  - also with allergies  - using advair as rx'ed    - stopped flomax as it wasn't helping  - would like to hold off on meds since not helpful and he is not bothered enough by symptoms    Also needs refill of simvastatin and allopurinol    Past medical, surgical, family, and social history were reviewed and non-contributory except as noted in HPI.    Outpatient Medications Marked as Taking for the 01/24/22 encounter (Office Visit) with [name], MD, MS   Medication   ? ADVAIR HFA 115-21 MCG/ACT inhaler   ? albuterol 90 mcg/act inhaler   ? allopurinol 300 mg tablet   ? cyanocobalamin 1000 mcg tablet   ? fish oil 1000 mg capsule   ? losartan-hydroCHLOROthiazide 50-12.5 mg tablet   ? montelukast 10 mg tablet   ? multivitamin tablet   ? simvastatin 20 mg tablet   ? [DISCONTINUED] ADVAIR [facility] 454-09 MCG/ACT inhaler   ? [DISCONTINUED] albuterol 90 mcg/act inhaler   ? [DISCONTINUED] allopurinol 300 mg tablet   ? [DISCONTINUED] fluticasone-salmeterol 115-21 mcg/act inhaler   ? [DISCONTINUED] losartan 50 mg tablet   ? [DISCONTINUED] simvastatin 20 mg tablet       No Known Allergies    Review of Systems:  As per HPI    Objective:   BP 131/62  ~ Pulse 77  ~ Temp 36.2 ?C (97.2 ?F)  ~ Ht 6' 1'' (1.854 m)  ~ Wt 193 lb 12.8 oz (87.9 kg)  ~ SpO2 100%  ~ BMI 25.57 kg/m?      Physical Exam  Constitutional:       General: He is not in acute distress.     Appearance: He is not toxic-appearing.   HENT:      Head: Normocephalic and atraumatic.   Eyes:      Extraocular Movements: Extraocular movements intact.      Conjunctiva/sclera: Conjunctivae normal.   Cardiovascular:      Rate and Rhythm: Normal rate and regular rhythm.   Pulmonary:  Effort: Pulmonary effort is normal. No respiratory distress.      Breath sounds: Normal breath sounds. No wheezing, rhonchi or rales.   Skin:     General: Skin is warm and dry.   Neurological:      General: No focal deficit present.      Mental Status: He is alert and oriented to person, place, and time.   Psychiatric:         Behavior: Behavior normal.          Assessment/Plan:   Diagnoses and all orders for this visit:    Primary hypertension  Well controlled on current regimen.  -     losartan-hydroCHLOROthiazide 50-12.5 mg tablet; Take 1 tablet by mouth daily.    Mild intermittent asthma without exacerbation  Reports now poorly controlled especially with current weather changes. Also with h/o allergies, will trial singulair. Lower s/f asthma exacerbation at this time given no increased WOB, lungs clear, O2 sat 100%.  -     albuterol 90 mcg/act inhaler; Inhale 1 puff every four (4) hours as needed for Wheezing or Shortness of Breath.  -     ADVAIR HFA 115-21 MCG/ACT inhaler; USE 2 INHALATIONS BY MOUTH TWICE DAILY.  -     montelukast 10 mg tablet; Take 1 tablet (10 mg total) by mouth every evening.  -     Return precautions discussed    Idiopathic gout of right wrist, unspecified chronicity  -     allopurinol 300 mg tablet; Take 1 tablet (300 mg total) by mouth daily.    Hyperlipidemia, unspecified hyperlipidemia type  -     simvastatin 20 mg tablet; Take 1 tablet (20 mg total) by mouth at bedtime.      Return if symptoms worsen or fail to improve.    Author:  [name], MD, MS 01/24/2022 9:38 AM

## 2022-01-23 NOTE — Progress Notes
[facility] INTERNAL MEDICINE & PEDIATRICS  [facility]  [address]  [address]  [place] [address]  Phone: [phone]  FAX: [phone]    Sports Medicine Consult Note    Date of Service: 01/23/2022  Requesting Physician: Earlie Raveling, MD  PCP: Garret Reddish., MD    Subjective:     Chief Complaint: Leg Pain (Right)    Patient Active Problem List   Diagnosis   ? Gout of wrist   ? Hyperlipidemia   ? IBS (irritable bowel syndrome)   ? Low vitamin B12 level   ? Macrocytosis without anemia   ? Snores   ? Cyst of kidney, acquired   ? BMI 31.0-31.9,adult   ? Reactive airway disease with wheezing   ? Macrocytic anemia   ? Alcohol use   ? Prediabetes   ? Tremor of both hands   ? Obstructive sleep apnea   ? Epilepsy (HCC/RAF)   ? Hx of colonic polyp   ? Seasonal allergies   ? Skin cancer       HPI:   Joseph Donovan is a 66 y.o. male with the above issues, presents with pain in the L5 dermatome of the R leg x1 year.  Gets pain mostly in the gluteal region that radiates down to the dorsal aspect of the foot.  Mostly has pain in the lower leg.  Experiences numbness from mid lower leg to the foot.  Tried Aleve which helped.  Has not tried PT yet.      Review of Systems  General: no fevers, chills  Psychological: negative  Ophthalmic: no vision changes  ENT: no nasal congestion, rhinorrhea, or sore throat  Respiratory: no cough, shortness of breath, or wheezing  Cardiovascular: no chest pain  Gastrointestinal:  no abdominal pain  Musculoskeletal: see HPI  Neurological: no dizziness  Dermatological: no rash    Past Medical History  He has a past medical history of Asthma (Recur this year), Chicken pox, Cyst of kidney, acquired, Epilepsy (HCC/RAF), Gout of wrist (08/04/2014), colonic polyp, Hyperlipidemia, IBS (irritable bowel syndrome), Macrocytic anemia (07/15/2020), Obstructive sleep apnea (12/14/2019), Peptic ulcer disease, Prediabetes, Seasonal allergies, and Skin cancer.    Surgical History  He has a past surgical history that includes Tonsillectomy; Colonoscopy (2020); and Eye surgery.    Family History  His family history includes Brain cancer (age of onset: 27) in his father; Cancer in his father; Depression in his mother; Meniere's disease in his mother.    Social History  Social History     Occupational History   ? Not on file   Tobacco Use   ? Smoking status: Former     Packs/day: 0.50     Years: 30.00     Additional pack years: 0.00     Total pack years: 15.00     Types: Cigarettes     Quit date: 03/27/2007     Years since quitting: 14.8   ? Smokeless tobacco: Never   Substance and Sexual Activity   ? Alcohol use: Yes     Alcohol/week: 2.4 oz     Types: 4 Glasses of Wine (5 oz) per week     Comment: socially   ? Drug use: Not Currently   ? Sexual activity: Yes     Partners: Female     Birth control/protection: Male Condom, Withdrawal / Coitus Interruptus     Social History     Social History Narrative    Married  with step daughter.         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     Will be retiring.        Immunization History   Administered Date(s) Administered   ? COVID-19 Gala Murdoch Fall 2023 12y and up) PF, 50 mcg/0.5 mL 12/25/2021   ? COVID-19, mRNA, (Moderna) 100 mcg/0.5 mL 06/19/2019, 07/17/2019, 01/28/2020, 06/25/2020, 12/02/2020   ? COVID-19, mRNA, bivalent (Moderna) 50 mcg/0.5 mL (12y and up) 12/02/2020   ? Influenza vaccine IM quadrivalent (Afluria Quad) (PF) SYR (20 years of age and older) 02/07/2015, 01/27/2016, 12/13/2016   ? Td 10/05/2003   ? Tdap 04/12/2009, 10/15/2016   ? Typhoid Inactivated, ViCPs 10/05/2003   ? influenza vaccine IM cell culture quadrivalent (Flucelvax QUAD) (PF) SYR (51 months of age and older) 12/11/2019   ? influenza vaccine IM quadrivalent (Fluarix Quad) (PF) SYR (40 months of age and older) 03/11/2018, 12/26/2018   ? influenza vaccine IM quadrivalent high dose (Fluzone High Dose Quad) (PF) SYR (8 years of age and older) 12/11/2021   ? influenza, unspecified formulation 02/07/2015, 12/31/2015, 01/27/2016, 12/13/2016, 12/02/2020   ? zoster vac recomb adjuvanted (Shingrix) 03/29/2018, 05/31/2018       Medications/Supplements  No outpatient medications have been marked as taking for the 01/23/22 encounter (Office Visit) with Gillis Ends, MD.       Objective:     Physical Exam  BP 127/75  ~ Pulse 63  ~ Temp 36.6 ?C (97.8 ?F) (Tympanic)  ~ Resp 17  ~ Wt 201 lb (91.2 kg)  ~ SpO2 99%  ~ BMI 26.52 kg/m?     General: alert, well appearing, and in no distress.  Head: Atraumatic, normocephalic  Eyes: EOMI, conjunctiva normal  Resp: symmetrical chest rise, no retractions  CV: well perfused, no cyanosis  MSK:  Able to reach mid shins when bending forward.  Decreased range of motion to lateral rotation of the back.  Nontender over the paraspinal muscles or midline in the lumbar spine.  Nontender over the SI joints.  Neuro:  Slight decreased sensation over the lateral aspect of the right lower leg.  Strength in lower extremities 5/5 bilaterally.  Achilles and patellar reflexes 2+ bilaterally.  Skin: no rash, erythema, or breaks in skin  Psych:  Normal affect, insight, and judgment.    Studies  X-ray L spine AP/lat 01/23/2022: viewed, diffuse degenerative disc disease, moderate at L5-S1.    Assessment/Plan:     Joseph Donovan is a 66 y.o. male who presents with R L5 radiculopathy type extends from the gluteal region to the dorsal aspect of the foot.  He has had pain in this distribution for the past year.  There is decreased sensation to light touch over the lateral aspect of the right lower leg.  Referral to PT placed.  Recommend MRI of the lumbar spine for further evaluation.    The above plan of care, diagnosis, orders, and follow-up were discussed with the patient.  Questions related to this recommended plan of care were answered.    Gillis Ends, MD  01/23/2022 at 3:06 PM

## 2022-01-23 NOTE — Patient Instructions
Call [phone] to schedule the MRI      [facility]     [facility]  [address], [address] [place], [place] [address]  Ph: [phone] ~ Fax: [phone]    [address], [address]  [place], [place] [address]  Ph: [phone] ~ Fax: [phone]    [address] [place], [address] [place], [place] [address]  Ph: [phone] ~ Fax: [phone]    [facility]  [address] [place], [place] [address]  Ph: [phone] ~ Fax: [phone]    [address]  [place], [place] [address]  Ph: [phone] ~ Fax: [phone]    [facility]  [address], [address] [place], [place] [address]  Ph: [phone] ~ Fax: [phone]    [facility]  [address] [place], [place] [address]  Ph: [phone] ~ Fax: [phone]    [facility]  [address] [place], [place] [address]  Ph: [phone] ~ Fax: [phone]    [facility]   [address] [place], [place] [address]  Ph: [phone] ~ Fax: [phone]    [facility]  [address] [place], [place] [address]  Ph: [phone] ~ Fax: [phone]    [address] [place], [address]  [place], [place] [address]  Ph: [phone] ~ Fax: [phone]    [facility]  [address] [place], [place] [address]  Ph: [phone] ~ Fax: [phone]    [facility]   [address] [place], [place] [address]  Ph: [phone] ~ Fax: [phone]    [facility]  [address] [place], [place] [address]  .Ph: [phone] ~ Fax: [phone]     [facility]  [address] [place], [place] [address]  Ph: [phone] ~ Fax: [phone]     [facility]  [address] [place], [place] [address]  Ph: [phone] ~ Fax: [phone]    [facility]  [address] [place], [place] [address]  Ph: [phone]    [facility]  [address] [place] [place], [place] [address]  Ph: [phone]

## 2022-01-24 ENCOUNTER — Ambulatory Visit: Payer: PRIVATE HEALTH INSURANCE

## 2022-01-24 ENCOUNTER — Ambulatory Visit: Payer: PRIVATE HEALTH INSURANCE | Attending: Student in an Organized Health Care Education/Training Program

## 2022-01-24 DIAGNOSIS — E785 Hyperlipidemia, unspecified: Secondary | ICD-10-CM

## 2022-01-24 DIAGNOSIS — M10031 Idiopathic gout, right wrist: Secondary | ICD-10-CM

## 2022-01-24 DIAGNOSIS — J4520 Mild intermittent asthma, uncomplicated: Secondary | ICD-10-CM

## 2022-01-24 MED ORDER — MONTELUKAST SODIUM 10 MG PO TABS
10 mg | ORAL_TABLET | Freq: Every evening | ORAL | 3 refills | Status: AC
Start: 2022-01-24 — End: ?

## 2022-01-24 MED ORDER — SIMVASTATIN 20 MG PO TABS
20 mg | ORAL_TABLET | Freq: Every evening | ORAL | 1 refills | Status: AC
Start: 2022-01-24 — End: ?

## 2022-01-24 MED ORDER — ALBUTEROL SULFATE HFA 108 (90 BASE) MCG/ACT IN AERS
2 | RESPIRATORY_TRACT | 2 refills | Status: AC | PRN
Start: 2022-01-24 — End: ?

## 2022-01-24 MED ORDER — ALBUTEROL SULFATE HFA 108 (90 BASE) MCG/ACT IN AERS
2 | Freq: Four times a day (QID) | RESPIRATORY_TRACT | 2 refills | Status: AC | PRN
Start: 2022-01-24 — End: 2022-01-25

## 2022-01-24 MED ORDER — ADVAIR HFA 115-21 MCG/ACT IN AERO
1 refills | Status: AC
Start: 2022-01-24 — End: ?

## 2022-01-24 MED ORDER — ADVAIR HFA 115-21 MCG/ACT IN AERO
11 refills
Start: 2022-01-24 — End: ?

## 2022-01-24 MED ORDER — ALBUTEROL SULFATE HFA 108 (90 BASE) MCG/ACT IN AERS
1 | RESPIRATORY_TRACT | 2 refills | Status: AC | PRN
Start: 2022-01-24 — End: 2022-01-25

## 2022-01-24 MED ORDER — ALBUTEROL SULFATE HFA 108 (90 BASE) MCG/ACT IN AERS
1 | RESPIRATORY_TRACT | 1 refills | Status: AC | PRN
Start: 2022-01-24 — End: 2022-01-25

## 2022-01-24 MED ORDER — LOSARTAN POTASSIUM-HCTZ 50-12.5 MG PO TABS
1 | ORAL_TABLET | Freq: Every day | ORAL | 1 refills | Status: AC
Start: 2022-01-24 — End: ?

## 2022-01-24 MED ORDER — ALLOPURINOL 300 MG PO TABS
300 mg | ORAL_TABLET | Freq: Every day | ORAL | 1 refills | Status: AC
Start: 2022-01-24 — End: ?

## 2022-01-24 NOTE — Addendum Note
Addended by: Benetta Spar on: 01/24/2022 03:02 PM     Modules accepted: Orders

## 2022-01-26 ENCOUNTER — Ambulatory Visit: Payer: PRIVATE HEALTH INSURANCE | Attending: Student in an Organized Health Care Education/Training Program

## 2022-01-26 DIAGNOSIS — L821 Other seborrheic keratosis: Secondary | ICD-10-CM

## 2022-01-26 DIAGNOSIS — L814 Other melanin hyperpigmentation: Secondary | ICD-10-CM

## 2022-01-26 DIAGNOSIS — D1801 Hemangioma of skin and subcutaneous tissue: Secondary | ICD-10-CM

## 2022-01-26 DIAGNOSIS — D229 Melanocytic nevi, unspecified: Secondary | ICD-10-CM

## 2022-01-26 NOTE — Progress Notes
Dermatology Clinic Note  ?  Chief complaint:   Chief Complaint   Patient presents with   ? General Skin Check     Referring provider: Lourena Simmonds., MD    ?History of present illness:  Joseph Donovan, a 66 y.o. male presenting for FBSE    Patient denies lesions of concern  Using SPF 50+ daily    Hx of skin cancer: Hx of NMSC - previously managed by outsider Derm    ?PMH/Meds/All/Soc/Fam hx: Reviewed in [facility]     No new changes to PMH/Meds/All/Soc/Fam hx - please refer to today's intake form.  Patient Active Problem List   Diagnosis   ? Gout of wrist   ? Hyperlipidemia   ? IBS (irritable bowel syndrome)   ? Low vitamin B12 level   ? Macrocytosis without anemia   ? Snores   ? Cyst of kidney, acquired   ? BMI 31.0-31.9,adult   ? Reactive airway disease with wheezing   ? Macrocytic anemia   ? Alcohol use   ? Prediabetes   ? Tremor of both hands   ? Obstructive sleep apnea   ? Epilepsy (HCC/RAF)   ? Hx of colonic polyp   ? Seasonal allergies   ? Skin cancer     Outpatient Medications Prior to Visit   Medication Sig   ? ADVAIR HFA 115-21 MCG/ACT inhaler USE 2 INHALATIONS BY MOUTH TWICE DAILY.   ? albuterol 90 mcg/act inhaler Inhale 2 puffs every four (4) hours as needed for Wheezing or Shortness of Breath.   ? allopurinol 300 mg tablet Take 1 tablet (300 mg total) by mouth daily.   ? cyanocobalamin 1000 mcg tablet Take 1 tablet (1,000 mcg total) by mouth.   ? fish oil 1000 mg capsule Take 1 capsule (1 g total) by mouth daily.   ? losartan-hydroCHLOROthiazide 50-12.5 mg tablet Take 1 tablet by mouth daily.   ? montelukast 10 mg tablet Take 1 tablet (10 mg total) by mouth every evening.   ? multivitamin tablet Take by mouth daily.   ? simvastatin 20 mg tablet Take 1 tablet (20 mg total) by mouth at bedtime.     No facility-administered medications prior to visit.     No Known Allergies  ?  Review of systems:   ?Constitutional: Negative  Skin otherwise Negative  Conjunctiva and Lids: Negative    Objective: General appearance: well-developed/well nourished for stated age, no apparent distress.  Mental status: alert and oriented.   Mood: cooperative, pleasant.  Affect: normal    A complete examination of skin was performed. This includes examination of the skin of the face, conjunctiva and eyelids, ears, lips and gums, scalp, hair, neck, chest, breast, axillae, abdomen, back, left and right upper and lower extremities, inguinal folds, hands and feet, toenails and fingernails. The positive findings are listed below.  The remainder of the exam was unremarkable for suspicious lesions.     Exam/Assessment/Plan:    Melanocytic nevi of the trunk - chronic, stable   - Exam: Scattered brown macules with reassuring dermatoscopic features  - Counseled on reassuring morphology today   - Counseled on photoprotection   - Counseled to return to care for any concerning changes    Lentigines - chronic, stable  - Exam: Brown macules with moth-eaten borders  - Counseled on reassuring morphology today   - Counseled on photoprotection   - Counseled to return to care for any concerning changes    Seborrheic keratoses - chronic, stable  -  Exam: Manson Passey, stuck-on papules  - Counseled on benign etiology without need for tx given asx    Cherry angiomas - chronic, stable  - Exam: Red macules and papules   - Counseled on benign etiology without need for tx    Sun protection strategies and skin cancer risk factors reviewed.   ?  Follow-up in 12 months or earlier PRN    Margarito Courser, MD  Dermatology

## 2022-02-01 ENCOUNTER — Ambulatory Visit: Payer: PRIVATE HEALTH INSURANCE | Attending: Student in an Organized Health Care Education/Training Program

## 2022-02-13 ENCOUNTER — Ambulatory Visit: Payer: PRIVATE HEALTH INSURANCE

## 2022-02-13 DIAGNOSIS — J4520 Mild intermittent asthma, uncomplicated: Secondary | ICD-10-CM

## 2022-02-13 DIAGNOSIS — M10031 Idiopathic gout, right wrist: Secondary | ICD-10-CM

## 2022-02-13 DIAGNOSIS — E785 Hyperlipidemia, unspecified: Secondary | ICD-10-CM

## 2022-02-13 DIAGNOSIS — I10 Essential (primary) hypertension: Secondary | ICD-10-CM

## 2022-02-13 MED ORDER — LOSARTAN POTASSIUM-HCTZ 50-12.5 MG PO TABS
1 | ORAL_TABLET | Freq: Every day | ORAL | 1 refills | Status: AC
Start: 2022-02-13 — End: ?

## 2022-02-13 MED ORDER — VITAMIN B-12 1000 MCG PO TABS
1000 ug | ORAL_TABLET | Freq: Every day | ORAL | 1 refills | Status: AC
Start: 2022-02-13 — End: ?

## 2022-02-13 MED ORDER — SIMVASTATIN 20 MG PO TABS
20 mg | ORAL_TABLET | Freq: Every evening | ORAL | 1 refills | Status: AC
Start: 2022-02-13 — End: ?

## 2022-02-13 MED ORDER — ADVAIR HFA 115-21 MCG/ACT IN AERO
1 refills | Status: AC
Start: 2022-02-13 — End: ?

## 2022-02-13 MED ORDER — MONTELUKAST SODIUM 10 MG PO TABS
10 mg | ORAL_TABLET | Freq: Every evening | ORAL | 3 refills | Status: AC
Start: 2022-02-13 — End: ?

## 2022-02-13 MED ORDER — FISH OIL 1000 MG PO CAPS
1 g | ORAL_CAPSULE | Freq: Every day | ORAL | 1 refills | Status: AC
Start: 2022-02-13 — End: ?

## 2022-02-13 MED ORDER — ALLOPURINOL 300 MG PO TABS
300 mg | ORAL_TABLET | Freq: Every day | ORAL | 1 refills | Status: AC
Start: 2022-02-13 — End: ?

## 2022-02-14 MED ORDER — ALBUTEROL SULFATE HFA 108 (90 BASE) MCG/ACT IN AERS
2 | RESPIRATORY_TRACT | 2 refills | Status: AC | PRN
Start: 2022-02-14 — End: ?

## 2022-02-14 NOTE — Addendum Note
Addended by: Benetta Spar on: 02/13/2022 04:51 PM     Modules accepted: Orders

## 2022-02-25 DIAGNOSIS — J4520 Mild intermittent asthma, uncomplicated: Secondary | ICD-10-CM

## 2022-02-26 ENCOUNTER — Ambulatory Visit: Payer: BLUE CROSS/BLUE SHIELD | Attending: Student in an Organized Health Care Education/Training Program

## 2022-02-26 NOTE — Progress Notes
[facility]    PATIENT: Joseph Donovan  MRN: [id number]  DOB: May 25, 1955  DATE OF SERVICE: 02/26/2022    PRIMARY CARE PROVIDER: Garret Reddish., MD  REFERRING PROVIDER: No ref. provider found    No chief complaint on file.    Patient Active Problem List   Diagnosis    Gout of wrist    Hyperlipidemia    IBS (irritable bowel syndrome)    Low vitamin B12 level    Macrocytosis without anemia    Snores    Cyst of kidney, acquired    BMI 31.0-31.9,adult    Reactive airway disease with wheezing    Macrocytic anemia    Alcohol use    Prediabetes    Tremor of both hands    Obstructive sleep apnea    Epilepsy (HCC/RAF)    Hx of colonic polyp    Seasonal allergies    Skin cancer       Subjective:   Joseph Donovan is a 66 y.o. male with above PMH who presents for:    #***    Past medical, surgical, family, and social history were reviewed and non-contributory except as noted in HPI.    No outpatient medications have been marked as taking for the 02/26/22 encounter (Appointment) with [name], MD, MS.       No Known Allergies    Review of Systems:  As per HPI    Objective:   There were no vitals taken for this visit.  Physical Exam    Lab/Imaging Review:    I have:    []  Reviewed/ordered  []  1  []  2  []  ? 3 unique laboratory, radiology, and/or diagnostic tests noted below.   []  Reviewed  []  1  []  2  []  ? 3 prior external notes and incorporated into patient assessment.   []  Discussed management or test interpretation with external provider(s) as noted.   []  Obtained history from someone other than patient   []  Decided to get outside medical records     []  Independently interpretated studies    Lab Studies:   {CE[facility] Labs Reviewed:35341}    Imaging Studies:   {CEG Last imaging:35340::''None''}    Assessment/Plan:   There are no diagnoses linked to this encounter.    Mild intermittent asthma without exacerbation  Reports now poorly controlled especially with current weather changes. Also with h/o allergies, will trial singulair. Lower s/f asthma exacerbation at this time given no increased WOB, lungs clear, O2 sat 100%.  -     albuterol 90 mcg/act inhaler; Inhale 1 puff every four (4) hours as needed for Wheezing or Shortness of Breath.  -     ADVAIR HFA 115-21 MCG/ACT inhaler; USE 2 INHALATIONS BY MOUTH TWICE DAILY.  -     montelukast 10 mg tablet; Take 1 tablet (10 mg total) by mouth every evening.  -     Return precautions discussed    No follow-ups on file.    Author:  [name], MD, MS 02/25/2022 7:43 PM      ***minutes were spent personally by me today on this encounter which include today's pre-visit review of the chart, obtaining appropriate history, performing an evaluation, documentation and discussion of management with details supported within the note for today's visit. The time documented was exclusive of any time spent on the separately billed procedure.

## 2022-02-27 ENCOUNTER — Ambulatory Visit: Payer: PRIVATE HEALTH INSURANCE

## 2022-02-27 ENCOUNTER — Inpatient Hospital Stay: Payer: PRIVATE HEALTH INSURANCE

## 2022-02-27 DIAGNOSIS — M5416 Radiculopathy, lumbar region: Secondary | ICD-10-CM

## 2022-02-28 ENCOUNTER — Ambulatory Visit: Payer: BLUE CROSS/BLUE SHIELD

## 2022-02-28 ENCOUNTER — Ambulatory Visit: Payer: PRIVATE HEALTH INSURANCE

## 2022-02-28 DIAGNOSIS — M48061 Spinal stenosis, lumbar region without neurogenic claudication: Secondary | ICD-10-CM

## 2022-02-28 DIAGNOSIS — M5416 Radiculopathy, lumbar region: Secondary | ICD-10-CM

## 2022-03-08 ENCOUNTER — Ambulatory Visit: Payer: BLUE CROSS/BLUE SHIELD | Attending: Student in an Organized Health Care Education/Training Program

## 2022-03-29 ENCOUNTER — Ambulatory Visit: Payer: BLUE CROSS/BLUE SHIELD

## 2022-03-29 ENCOUNTER — Ambulatory Visit: Payer: PRIVATE HEALTH INSURANCE

## 2022-03-30 ENCOUNTER — Ambulatory Visit: Payer: BLUE CROSS/BLUE SHIELD

## 2022-03-30 DIAGNOSIS — J4520 Mild intermittent asthma, uncomplicated: Secondary | ICD-10-CM

## 2022-03-30 MED ORDER — FLUTICASONE-SALMETEROL 115-21 MCG/ACT IN AERO
2 | Freq: Two times a day (BID) | RESPIRATORY_TRACT | 3 refills | Status: AC
Start: 2022-03-30 — End: ?

## 2022-04-04 ENCOUNTER — Ambulatory Visit: Payer: BLUE CROSS/BLUE SHIELD

## 2022-04-04 DIAGNOSIS — M48062 Spinal stenosis, lumbar region with neurogenic claudication: Secondary | ICD-10-CM

## 2022-04-04 MED ORDER — DICLOFENAC SODIUM 75 MG PO TBEC
75 mg | ORAL_TABLET | Freq: Two times a day (BID) | ORAL | 0 refills | Status: AC | PRN
Start: 2022-04-04 — End: ?

## 2022-04-04 MED ORDER — GABAPENTIN 300 MG PO CAPS
300 mg | ORAL_CAPSULE | Freq: Three times a day (TID) | ORAL | 0 refills | Status: AC
Start: 2022-04-04 — End: ?

## 2022-04-04 NOTE — Progress Notes
[facility]  Department of Anesthesiology & Pain Medicine        Consultation Note:  Date of Service: 04/04/2022  Primary Care Physician: Garret Reddish., MD    Referring Physician: Gillis Ends, MD    Chief Complaint:   Leg Pain (Right leg pain and right foot pain, numbness, sometimes sharp pain)      History of Present Illness:  67 y.o. male with a past medical history significant for HTN presenting for continued management of buttock and leg pain. The patient reports they have had chronic buttock and leg pain for months. Today the pain is located in their right buttock with radiation into the right thigh and lateral calf. The pain is worse with standing, walking , and alleviated with sitting. The patient was referred to our spine clinic for possible interventional treatment options.     VAS AVG 7/10       NUMBNESS/TINGLING: Denies  WEAKNESS: Denies  URINARY INCONTINENCE: No  FECAL INCONTINENCE: No  SADDLE ANESTHESIA : No      Current treatment:   Ibuprofen  Tylenol  Heat    Physical therapy: completed 6+ sessions in last 3 months, completes HEP       CURES Report:  Last reviewed on 04/04/2022 and was appropriate.      PAST MEDICAL HISTORY:  Past Medical History:   Diagnosis Date    Asthma Recur this year    Childhood then stopped. Recurred this year    Chicken pox     Cyst of kidney, acquired     Epilepsy (HCC/RAF)     Gout of wrist 08/04/2014    Last Assessment & Plan:  History of. No recent flare up per patient. Counseled on diet modification. Increase water intake. Check uric acid level. Last Assessment & Plan:  Has been doing well on allopurinol without improvement    Hx of colonic polyp     Hyperlipidemia     IBS (irritable bowel syndrome)     diarrhea    Macrocytic anemia 07/15/2020    Obstructive sleep apnea 12/14/2019    Peptic ulcer disease     Prediabetes     Seasonal allergies     Skin cancer        PAST SURGICAL HISTORY:   Past Surgical History:   Procedure Laterality Date    COLONOSCOPY  2020    Normal colo at [facility]. Needs to bring in pathology report. Had polyps    EYE SURGERY      Lasik    MOHS SURGERY      TONSILLECTOMY         CURRENT MEDICATIONS:  Current Outpatient Medications   Medication Sig    albuterol 90 mcg/act inhaler Inhale 2 puffs every four (4) hours as needed for Wheezing or Shortness of Breath.    allopurinol 300 mg tablet Take 1 tablet (300 mg total) by mouth daily.    cyanocobalamin 1000 mcg tablet Take 1 tablet (1,000 mcg total) by mouth daily.    fish oil 1000 mg capsule Take 1 capsule (1 g total) by mouth daily.    fluticasone-salmeterol 115-21 mcg/act inhaler Inhale 2 puffs two (2) times daily.    hydroCHLOROthiazide 12.5 MG tablet     losartan-hydroCHLOROthiazide 50-12.5 mg tablet Take 1 tablet by mouth daily.    montelukast 10 mg tablet Take 1 tablet (10 mg total) by mouth every evening.    Multiple Vitamin (MULTIVITAMIN ADULT PO) Take by mouth.  multivitamin tablet Take by mouth daily.    simvastatin 20 mg tablet Take 1 tablet (20 mg total) by mouth at bedtime.    [DISCONTINUED] ADVAIR HFA 115-21 MCG/ACT inhaler USE 2 INHALATIONS BY MOUTH TWICE DAILY.     No current facility-administered medications for this visit.         Physical Examination:   Vitals:   Vitals:    04/04/22 1526   BP: 126/74   Pulse: 82   Resp: 18   Temp: 36.7 ?C (98 ?F)   TempSrc: Forehead   SpO2: 99%   Weight: 193 lb (87.5 kg)   Height: 6' 1'' (1.854 m)        General: AAOx3, NAD and appropriately groomed  Body habitus: normal   Affect/Mood: appropriate  Pulm:  Breathing non-labored    Gait: Gait is anantalgic.    -The patient is able to ambulate without assistance   SPINE:  Palpation at the midline lumbar - sacral level reveals no tenderness    Range of Motion:   Lumbar Presence of Pain   Flexion Full N   Extension Full N       Lower Extremity Motor:  L2-S1 Hip Flex KE KF AT EHL  PF   RLE 5 5 5 5 5 5    LLE 5 5 5 5 5 5        Sensation (light touch):  Right Lower Extremity: Decreased   Left Lower Extremity: Normal      Provocative Tests Result   Lumbar Facet Loading -   SLR  -   SIJ tenderness with palpation -   Patrick/FABER -   Gaenslen's -   Thigh Thrust -   Scour's -   Trochanteric bursa tenderness -        TPs:   No palpable lumbar paraspinals TP    Skin:  No scars in examined area. No rashes/suspicious skin lesions.  Extremities No edema present                  Imaging Studies:     02/27/22 Lumbar MRI:           VERTEBRAE: Mild straightening of the normal lumbar lordosis. Normal vertebral body heights, alignment, and marrow signal of the lumbar spine. T2 signal hypointensity involving the left iliac bone, adjacent to the left sacroiliac joint (5-44), may   represent sclerosis.     DISCS: Mild lower lumbar spine disc height loss and desiccation, predominantly at L3-L4, L4-L5, and L5-S1.     FACET JOINTS: Mild to moderate facet hypertrophy in the lower lumbar spine, as described below.     SPINAL CANAL: Congenital narrowing of the spinal canal. Normal contour and position of the conus medullaris. No abnormal epidural fluid collection.     PARASPINAL SOFT TISSUES: Bilateral renal cysts, the largest measuring up to 5.0 cm in AP dimension in the right lower pole (5-22).     Findings by level:  T12-L1: No significant focal disc protrusion, spinal canal stenosis, or neural foraminal stenosis.  L1-L2: No significant focal disc protrusion, spinal canal stenosis, or neural foraminal stenosis.  L2-L3: Minimal circumferential disc bulge. No spinal canal stenosis or neural foraminal stenosis.  L3-L4: Circumferential disc bulge, mild ligamentum flavum thickening, and moderate facet hypertrophy causes minimal stenosis of the spinal canal. No neural foraminal stenosis.   L4-L5: Circumferential disc bulge, ligamentum flavum thickening, and moderate bilateral facet hypertrophy cause moderate spinal canal and bilateral neural foraminal stenosis.  L5-S1: Circumferential disc bulge.  No spinal canal stenosis or neural foraminal stenosis.       Assessment:    Lumbar neurogenic claudication  L4-5 moderate stenosis     Impression:    66 y.o. male with a past medical history significant for HTN presenting for continued management of buttock and leg pain consistent with possible lumbar neurogenic claudication. We discussed risks/benefits of a right L4-5 interlaminar epidural steroid injection including but not limited to infection, bleeding, treatment failure, worsening pain, spinal headache & spinal nerve/cord related risks, and the patient agreed to proceed.          Treatment Recommendations:   1) Medical Modalities:  -Dicloflenac 75 mg BID PRN  -Gabapentin 300 mg TID  2) Interventional Modalities: Right L4-5 interlaminar epidural steroid injection   3) Behavioral Medicine Modalities: None  4) Imaging: reviewed lumbar MRI  5) Other Modalities: Continue PT     A total of 46 minutes of time was spent on this encounter including reviewing prior records, imaging and laboratory results, and explanation of diagnosis, prognosis, risks and benefits of treatments, EHR documentation, importance of instruction and compliance, education, counseling and coordination of all future care and a detailed question and answer session.     Lonzo Candy, MD  Assistant Clinical Professor  Interventional Spine & Pain Medicine   Department of Anesthesiology  [place] [facility] [place], [place]

## 2022-04-04 NOTE — Patient Instructions
Contact your PCP an ultrasound of your kidneys

## 2022-04-05 ENCOUNTER — Ambulatory Visit: Payer: BLUE CROSS/BLUE SHIELD

## 2022-04-10 ENCOUNTER — Ambulatory Visit: Payer: BLUE CROSS/BLUE SHIELD

## 2022-04-10 DIAGNOSIS — M48062 Spinal stenosis, lumbar region with neurogenic claudication: Secondary | ICD-10-CM

## 2022-04-12 ENCOUNTER — Inpatient Hospital Stay
Admit: 2022-04-12 | Discharge: 2022-04-12 | Disposition: A | Discharge status: Home / Self Care | Payer: BLUE CROSS/BLUE SHIELD

## 2022-04-12 ENCOUNTER — Telehealth: Payer: PRIVATE HEALTH INSURANCE | Attending: Student in an Organized Health Care Education/Training Program

## 2022-04-12 DIAGNOSIS — G40309 Generalized idiopathic epilepsy and epileptic syndromes, not intractable, without status epilepticus: Secondary | ICD-10-CM

## 2022-04-12 DIAGNOSIS — M5416 Radiculopathy, lumbar region: Secondary | ICD-10-CM

## 2022-04-12 MED ADMIN — LIDOCAINE HCL (PF) 1 % IJ SOLN: @ 19:00:00 | Stop: 2022-04-12 | NDC 63323049257

## 2022-04-12 MED ADMIN — TRIAMCINOLONE ACETONIDE 40 MG/ML IJ SUSP: @ 19:00:00 | Stop: 2022-04-12 | NDC 70121116801

## 2022-04-12 MED ADMIN — SODIUM CHLORIDE 0.9 % IV SOLN: @ 19:00:00 | Stop: 2022-04-12 | NDC 00338004904

## 2022-04-12 MED ADMIN — IOHEXOL 300 MG/ML IJ SOLN: @ 19:00:00 | Stop: 2022-04-12 | NDC 00407141310

## 2022-04-12 NOTE — Progress Notes
EPILEPSY CLINIC CONSULT NOTE    PATIENT:  Joseph Donovan  MRN:  [id number]  DOB:  Sep 07, 1955  DATE OF SERVICE:  04/12/2022    REFERRING PHYSICIAN: Garret Reddish., MD  CHIEF COMPLAINT/REASON FOR CONSULT: medication management    Subjective:     History of Present Illness (09/05/2020):  Joseph Donovan is a 67 y.o. left-handed (taught to use right hand) male with a history of epilepsy, gout, high cholesterol, and asthma, presenting for medication management.    The patient states that he began having seizures at age 54. He was started on a barbiturate (thinks it was phenobarbital) and later switched to depakote 500/1000. His seizures became nocturnal after a couple years (noticed in the morning by the presence of severe headache) and then went away completely. He is not sure exactly when his last seizure was, but does not think he had any after starting university. His seizures are described as LOC with foaming at the mouth, tongue biting, and shaking in all extremities, consistent with tonic-clonic seizures. He vaguely remembers having a sensation occurring 1-2 minutes before some seizures, but does not remember what it was. He is not aware of being diagnosed with any specific type of epilepsy. He has had EEG in the distant past and does not know the findings. Had recent MRI in 2020 that was normal. There have been no events concerning for nocturnal seizures.    He denies history of staring spells and morning myoclonus. States he always did pretty well in school. He is currently on depakote 250/500 and tolerating well except for slight bilateral hand tremor. He previously thought about decreasing meds but did not due to not wanting to interfere with work. He is now nearing retirement and would like to avoid the SE of Depakote.    Interval History 04/27/2021:  Patient has not had any seizures for 35-40 years. Patient is still taking Depakote ER 250 mg BID. He continues to experience a slight tremor in his hands that improved with the decrease in Depakote dose, however the tremor continues to impact his function a bit, worse when he is holding objects (he spills when holding a glass of water).    Interval History 08/31/2021:  Patient reports no interval seizures, stopped Depakote in early 05/2021. He stopped driving for 3 months following medication taper without issue and restarted driving a few days ago.    Interval History 04/12/2022:  No seizures. Patient is doing well. Currently driving without incident.    Ictal Behavior:  Loss of consciousness and bilateral tonic-clonic activity with tongue bite, often nocturnal but may have had a warning with seizures while awake (may have been a smell or taste but unsure); frequency: began at age 64, none since university.    Past Medical History:  Past Medical History:   Diagnosis Date    Asthma Recur this year    Childhood then stopped. Recurred this year    Chicken pox     Cyst of kidney, acquired     Epilepsy (HCC/RAF)     Gout of wrist 08/04/2014    Last Assessment & Plan:  History of. No recent flare up per patient. Counseled on diet modification. Increase water intake. Check uric acid level. Last Assessment & Plan:  Has been doing well on allopurinol without improvement    Hx of colonic polyp     Hyperlipidemia     Hypertension     IBS (irritable bowel syndrome)     diarrhea  Macrocytic anemia 07/15/2020    Obstructive sleep apnea 12/14/2019    no CPAP    Peptic ulcer disease     Prediabetes     Seasonal allergies     Skin cancer      Past Surgical History:   Procedure Laterality Date    COLONOSCOPY  2020    Normal colo at [facility]. Needs to bring in pathology report. Had polyps    EYE SURGERY Bilateral     Lasik    MOHS SURGERY      TONSILLECTOMY       No Known Allergies    Current Outpatient Medications   Medication Instructions    albuterol 90 mcg/act inhaler 2 puffs, Inhalation, Every 4 hours PRN    allopurinol (ZYLOPRIM) 300 mg, Oral, Daily    cyanocobalamin (CYANOCOBALAMIN) 1,000 mcg, Oral, Daily    diclofenac (VOLTAREN) 75 mg, Oral, Every 12 hours PRN    fish oil 1 g, Oral, Daily    fluticasone-salmeterol 115-21 mcg/act inhaler 2 puffs, Inhalation, 2 times daily    gabapentin (NEURONTIN) 300 mg, Oral, 3 times daily    hydroCHLOROthiazide 12.5 MG tablet     losartan-hydroCHLOROthiazide 50-12.5 mg tablet 1 tablet, Oral, Daily    montelukast (SINGULAIR) 10 mg, Oral, Every evening    Multiple Vitamin (MULTIVITAMIN ADULT PO) Oral    multivitamin tablet Oral, Daily    simvastatin (ZOCOR) 20 mg, Oral, Every night at bedtime     Seizure Risk Factors:  Birth/Development: unremarkable  History of febrile seizures: no  History of CNS infection: no  History of stroke: no  History of head trauma: no    NES Risk Factors:  Psychiatric comorbidities: not addressed  History of physical, sexual, or emotional abuse: not addressed    Current Anti-Seizure Medications: None    Prior/Failed Anti-Seizure Medications: Phenobarbital, Depakote ER 250 mg BID (successfully controlled seizures for 35-40 years, tapered off due to sustained seizure freedom)    Family History:  Father passed away of a brain tumor. Paternal uncle and his son may have had brain tumor. Relative on father's side may have had seizure. Brother may have had 1-2 seizures.    Social History:  Works at [employer] in [employer]. Lives with wife. Currently driving. Born in [place].  Tobacco: quit many years ago  Alcohol: a couple glasses of wine on weekends  Recreational Drugs: no  Driving: currently driving    Review of Systems:  Fourteen point review of systems negative except as described in HPI.    Objective:     Vitals: There were no vitals taken for this visit. Video visit.    General: In no acute distress  Head: Normocephalic, atraumatic. External ears and nose normal.   Eyes: Normal sclerae and conjunctivae bilaterally.  Pulmonary: No accessory muscle use or respiratory distress.  Psych: Normal mood with congruent affect.    Mental status: Alert and oriented. Speech clear and fluent without paraphasias. Answers questions appropriately. Fund of knowledge good.   Cranial nerves: Gaze conjugate, no nystagmus. Face symmetrical with facial movements preserved. Hearing grossly intact.  Motor: No adventitious movements.   Coordination: Grossly intact.  Gait/Station: Normal casual gait.    Studies:  04/23/18 MRI brain w+wo contrast  No infarct, intracranial hemorrhage, mass effect or abnormal enhancement.    09/22/2020 Routine EEG  Normal EEG in the awake and drowsy states.    11/15/2020 MRI brain w+wo contrast epilepsy  No structural abnormality is identified to explain epilepsy. No significant  neural change since prior exam. There is a small developmental venous anomaly in the right superior frontal lobe. There is no abnormal parenchymal or leptomeningeal enhancement.    Assessment:     Joseph Donovan is a 67 y.o. left-handed (taught to use right hand) male with a history of epilepsy, gout, high cholesterol, and asthma, presenting for medication management. 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 He has remained seizure-free for >35-40 years, previously on Depakote ER 250 mg BID and was experiencing a tremor that has improved with reducing Depakote dose. We had discussed the risks and benefits of medication taper and advised the patient that he should refrain from driving during the taper and for 3 months after stopping therapy. Since tapering off Depakote in early 05/2021, he has remained seizure-free and is currently driving.    Plan/Recommendation:     - Continue to monitor off anti-seizure medications  - Patient is cleared to continue driving, advised to be cautious if he is not feeling well or has not slept  - Reviewed seizure precautions    Return if symptoms worsen or fail to improve. Transition to NP clinic for well-controlled epilepsy.    Patient and plan discussed with attending, Dr. Rennis Chris.  Author:  Velva Harman, MD

## 2022-04-12 NOTE — H&P
UPDATED H&P REQUIREMENT    For [facility] and [facility]    WHAT IS THE STATUS OF THE PATIENT'S MOST CURRENT HISTORY AND PHYSICAL?   - The most current H&P was performed within the past 24 hours. No additional updated H&P documentation is necessary.     REFER TO MEDICAL STAFF POLICIES REGARDING PRE-PROCEDURE HISTORY AND PHYSICAL EXAMINATION AND UPDATED H&P REQUIREMENTS BELOW:    [facility] and [facility] Medical Staff Policy 200 - For Patients Undergoing Procedures Requiring Moderate or Deep Sedation, General Anesthesia or Regional Anesthesia    Contents of a History and Physical Examination (H&P):    The H&P shall consist of chief complaint, history of present illness, allergies and medications, relevant social and family history, past medical history, review of systems and physical examination, and assessment and plan appropriate to the patient's age.    For Patients Undergoing Procedures Requiring Moderate or Deep Sedation, General Anesthesia or Regional Anesthesia:    1. An H&P shall be performed within 24 hours prior to the procedure by a qualified member of the medical staff or designee with appropriate privileges, except as noted in item 2 below.    2. If a complete history and physical was performed within thirty (30) calendar days prior to the patient's admission to the Medical Center for elective surgery, a member of the medical staff assumes the responsibility for the accuracy of the clinical information and will need to document in the medical record within twenty-four (24) hours of admission and prior to surgery or major invasive procedure, that they either attest that the history and physical has been reviewed and accepted, or document an update of the original history and physical relevant to the patient's current clinical status.    3. Providing an H&P for  patients undergoing surgery under local anesthesia is at the discretion of the Attending Physician.     4. When a procedure is performed by a dentist, podiatrist or other practitioner who is not privileged to perform an H&P, the anesthesiologist's assessment immediately prior to the procedure will constitute the 24 hour re-assessment.The dentist, podiatrist or other practitioner who is not privileged to perform an H&P will document the history and physical relevant to the procedure.    5. If the H&P and the written informed consent for the surgery or procedure are not recorded in the patient's medical record prior to surgery, the operation shall not be performed unless the attending physician states in writing that such a delay could lead to an adverse event or irreversible damage to the patient.    6. The above requirements shall not preclude the rendering of emergency medical or surgical care to a patient in dire circumstances.

## 2022-04-12 NOTE — Op Note
Lumbar Translaminar Epidural Steroid Injection    Date of Service :  04/12/2022  Pre-operative Diagnosis: Lumbosacral Radiculitis   Post-operative Diagnosis: Lumbosacral Radiculitis     Operation Title(s):  1. Right L4-L5 translaminar epidural steroid injection      2. Intraoperative fluoroscopy    Surgeon:   Dr. Lonzo Candy, MD    Anesthesia:   Local      Indications: The patient?s history and physical exam were reviewed. The risks, benefits and alternatives to the procedure were discussed, and all questions were answered to the patient?s satisfaction. The patient agreed to proceed, and written informed consent was obtained.    Procedure in Detail:  The patient was brought into the procedure room and placed in the prone position on the fluoroscopy table. Standard monitors were placed, and vital signs were observed throughout the procedure. The area of the lumbar spine was prepped with Chlor-Prep times three and draped in a sterile manner. The L4-L5 interspace was identified and marked under AP fluoroscopy. The skin and subcutaneous tissues in the area were anesthetized with 1% lidocaine. A 17-gauge Tuohy epidural needle was directed toward the interspace under fluoroscopic guidance until the ligamentum flavum was engaged. From this point, a loss of resistance technique with a glass syringe and saline was used to identify entrance of the needle into the epidural space. Once a good loss of resistance was obtained, negative aspiration was confirmed and 1 mL of contrast solution was injected. An appropriate epidurogram was noted.    Then, after negative aspiration, a solution consisting of 2-mL triamcinolone (40mg /mL) and 1-mL preservative-free saline was easily injected. The needle was removed with a 1% lidocaine flush. The patient?s back was cleaned and a bandage was placed over the site of needle insertion.    Disposition: The patient tolerated the procedure well, and there were no apparent complications. Vital signs remained stable throughout the procedure. The patient was taken to the recovery area where written discharge instructions for the procedure were given.       Lonzo Candy, MD  Assistant Clinical Professor  Interventional Spine & Pain Medicine   Department of Anesthesiology  [facility] [place], [place]

## 2022-04-12 NOTE — Discharge Instructions
DISCHARGE INSTRUCTIONS  You have had  CERVICAL TRANSLAMINAR EPIDURAL STEROID INJECTION     The following may occur:  Dizziness  Leg or arm weakness - you may need some assistance walking  Numbness and/or warmth in arms or legs  Pain at needle insertion site(s)  Bleeding     Contact your doctor if any of the following occur:  Fever of 101.5 or greater  Excessive bleeding     Shower the next day.  Please avoid going in a bath or pool today to avoid soaking the injection sites.      Resume all daily medications.        **Please note we use Chloroprep or Hibeclens to clean your skin prior to the procedure. If Chloroprep was used, there may be residual [place] solution left on your skin following the procedure. It is anti-bacterial, and will wash off in the shower.**  PHONE NUMBERS:   Weekdays 8am - 5pm: [phone]    For any questions after hours, call the page operator at [phone] and ask for the Pain Fellow on-call, pager #[phone]

## 2022-04-12 NOTE — H&P
SURGICAL HISTORY AND PHYSICAL  PATIENT:  Joseph Donovan  MRN:  [id number]  DOB:  July 18, 1955  DATE OF SERVICE:  04/12/2022  TIME:  10:01 AM  DIAGNOSIS:  Lumbar [place] w/ neurogenic claudication  Procedure(s):  right Lumbar L4-L5 translaminar epidural steroid injection with fluoroscopy and Local        [x]   INFORMED CONSENT OBTAINED INCLUDING RISKS BENEFITS AND ALTERNATIVES    [x]   INFORMED CONSENT OBTAINED FOR SEDATION INCLUDING RISKS, BENEFITS, AND/OR ALTERNATIVES    []   INFORMED CONSENT FOR BLOOD TRANSFUIONS INCLUDING RISKS, BENEFITS, AND/OR ALTERANTIVES     CHIEF COMPLAINT:  pain    HISTORY OF PRESENT ILLNESS:  This is a 67 y.o. male with a history of pain.         PAST MEDICAL HISTORY:    Past Medical History:   Diagnosis Date    Asthma Recur this year    Childhood then stopped. Recurred this year    Chicken pox     Cyst of kidney, acquired     Epilepsy (HCC/RAF)     Gout of wrist 08/04/2014    Last Assessment & Plan:  History of. No recent flare up per patient. Counseled on diet modification. Increase water intake. Check uric acid level. Last Assessment & Plan:  Has been doing well on allopurinol without improvement    Hx of colonic polyp     Hyperlipidemia     Hypertension     IBS (irritable bowel syndrome)     diarrhea    Macrocytic anemia 07/15/2020    Obstructive sleep apnea 12/14/2019    no CPAP    Peptic ulcer disease     Prediabetes     Seasonal allergies     Skin cancer        PAST SURGICAL HISTORY:    Past Surgical History:   Procedure Laterality Date    COLONOSCOPY  2020    Normal colo at [facility]. Needs to bring in pathology report. Had polyps    EYE SURGERY Bilateral     Lasik    MOHS SURGERY      TONSILLECTOMY         MEDS:    No current facility-administered medications for this encounter.       FAMILY HISTORY:  Denies pertinent family history    SOCIAL HISTORY:  Denies pertinent social history    HISTORY OF BLEEDING:  denies    ALLERGIES:  No Known Allergies       AIRWAY ASSESSMENT +:  []  Yes^  [x]  No Unable to visualize uvula  []  Yes^  [x]  No   C-spine precautions or unable to flex/extend neck  []  Yes^  [x]  No   Sleep apnea confirmed by sleep study or on CPAP at home  ASA CLASSIFICATION #:  []  Class I     A normal, healthy patient  [x]  Class II    A patient with mild systemic disease  []  Class III   A patient with severe systemic disease that limits                       activity but is not incapacitating  []  Class IV   A patient with an incapacitating systemic disease that                      is a constant threat to life  []  Class V    A moribund patient who is agonal, not  expected to                       live 24 hours, with or without operation; always                       considered an emergency (E)  Follow each class with an (E) to denote emergency operation.  * Required physical exam elements for H&P  # Required exam elements for all cases with sedation without an anesthesiologist present  ^ Denotes extra caution required      REVIEW OF SYSTEMS:     General ROS: negative for - chills, fatigue or fever  Psychological ROS: negative for - anxiety, behavioral disorder or concentration difficulties  Respiratory ROS: no cough, shortness of breath, or wheezing  Cardiovascular ROS: no chest pain or dyspnea on exertion  Gastrointestinal ROS: no abdominal pain, change in bowel habits, or black or bloody stools  Genito-Urinary ROS: no dysuria, trouble voiding, or hematuria  Musculoskeletal ROS: positive for - pain  Neurological ROS: no TIA or stroke symptoms     PHYSICAL EXAMINATION:  There were no vitals filed for this visit.  GENERAL:  alert, well appearing, and in no distress  HEENT:  Normal, araumatic, normocephalic  HEART:  regular rate and rhythm, no murmurs  LUNGS:  clear to auscultation, no wheezes or rales, unlabored breathing  ABDOMEN:  soft, nontender, nondistended, no masses or organomegaly  EXTREMITIES:  warm and dry, no edema, normal strength, tone, and muscle mass  GENITALIA:  deferred  NEURO:  gross motor exam normal by observation, strength normal and symmetric     ASSESSMENT & PLAN:     1.  Joseph Donovan is a 67 y.o. male with a diagnosis of pain who presents for interventional pain procedure.  2.  Risks and benefits of the procedure were reviewed with the patient/family and all questions have been answered.  The patient/family has agreed to proceed with the scheduled procedure.  3.  The patient/family has signed the appropriate consent documents.

## 2022-04-16 NOTE — Progress Notes
[facility]    PATIENT: Joseph Donovan  MRN: [id number]  DOB: November 23, 1955  DATE OF SERVICE: 04/18/2022    PRIMARY CARE PROVIDER: Garret Reddish., MD  REFERRING PROVIDER: No ref. provider found    No chief complaint on file.    Patient Active Problem List   Diagnosis    Gout of wrist    Hyperlipidemia    IBS (irritable bowel syndrome)    Low vitamin B12 level    Macrocytosis without anemia    Snores    Cyst of kidney, acquired    BMI 31.0-31.9,adult    Reactive airway disease with wheezing    Macrocytic anemia    Alcohol use    Prediabetes    Tremor of both hands    Obstructive sleep apnea    Epilepsy (HCC/RAF)    Hx of colonic polyp    Seasonal allergies    Skin cancer       Subjective:   Joseph Donovan is a 67 y.o. male with above PMH who presents for:    #***    Past medical, surgical, family, and social history were reviewed and non-contributory except as noted in HPI.    No outpatient medications have been marked as taking for the 04/18/22 encounter (Appointment) with [name], MD, MS.       No Known Allergies    Review of Systems:  As per HPI    Objective:   There were no vitals taken for this visit.  Physical Exam    Lab/Imaging Review:    I have:    []  Reviewed/ordered  []  1  []  2  []  ? 3 unique laboratory, radiology, and/or diagnostic tests noted below.   []  Reviewed  []  1  []  2  []  ? 3 prior external notes and incorporated into patient assessment.   []  Discussed management or test interpretation with external provider(s) as noted.   []  Obtained history from someone other than patient   []  Decided to get outside medical records     []  Independently interpretated studies    Lab Studies:   {CEG Labs Reviewed:35341}    Imaging Studies:   {CEG Last imaging:35340::''None''}    Assessment/Plan:   There are no diagnoses linked to this encounter.    No follow-ups on file.    Author:  [name], MD, MS 04/16/2022 12:57 PM      ***minutes were spent personally by me today on this encounter which include today's pre-visit review of the chart, obtaining appropriate history, performing an evaluation, documentation and discussion of management with details supported within the note for today's visit. The time documented was exclusive of any time spent on the separately billed procedure.

## 2022-04-18 ENCOUNTER — Ambulatory Visit: Payer: PRIVATE HEALTH INSURANCE | Attending: Student in an Organized Health Care Education/Training Program

## 2022-04-18 ENCOUNTER — Telehealth: Payer: PRIVATE HEALTH INSURANCE | Attending: Student in an Organized Health Care Education/Training Program

## 2022-04-18 DIAGNOSIS — N281 Cyst of kidney, acquired: Secondary | ICD-10-CM

## 2022-04-18 NOTE — Patient Instructions
For nephrology appt, please call:    [facility]  [address], [address]  [place], [place] [address]  [phone]

## 2022-04-18 NOTE — Progress Notes
[facility] Telemedicine Note     PATIENT:  Joseph Donovan  MRN:  [id number]  DOB:  Aug 05, 1955  DATE OF SERVICE:  04/18/2022    PRIMARY CARE PROVIDER: Garret Reddish., MD    No chief complaint on file.        Subjective:     Joseph EDMISTER is a a 67 y.o. male       Patient Consent to Telehealth   The patient agreed to participate in the video visit prior to joining the visit.         Prefers telemedicine visit vs in-person visit for follow-up visit for review of chronic medical conditions, and any other acute issues.  Consent obtained.    - had an MRI done for lumbar spine a while back and was told there was a polyp on his kidney  - no fhx of kidney disease  - no symptoms      Past Medical History:   Diagnosis Date    Asthma Recur this year    Childhood then stopped. Recurred this year    Chicken pox     Cyst of kidney, acquired     Epilepsy (HCC/RAF)     Gout of wrist 08/04/2014    Last Assessment & Plan:  History of. No recent flare up per patient. Counseled on diet modification. Increase water intake. Check uric acid level. Last Assessment & Plan:  Has been doing well on allopurinol without improvement    Hx of colonic polyp     Hyperlipidemia     Hypertension     IBS (irritable bowel syndrome)     diarrhea    Macrocytic anemia 07/15/2020    Obstructive sleep apnea 12/14/2019    no CPAP    Peptic ulcer disease     Prediabetes     Seasonal allergies     Skin cancer        Past Surgical History:   Procedure Laterality Date    COLONOSCOPY  2020    Normal colo at [facility]. Needs to bring in pathology report. Had polyps    EYE SURGERY Bilateral     Lasik    MOHS SURGERY      TONSILLECTOMY         No Known Allergies    Current Outpatient Medications   Medication Sig    albuterol 90 mcg/act inhaler Inhale 2 puffs every four (4) hours as needed for Wheezing or Shortness of Breath.    allopurinol 300 mg tablet Take 1 tablet (300 mg total) by mouth daily.    cyanocobalamin 1000 mcg tablet Take 1 tablet (1,000 mcg total) by mouth daily.    diclofenac 75 mg EC tablet Take 1 tablet (75 mg total) by mouth every twelve (12) hours as needed.    fish oil 1000 mg capsule Take 1 capsule (1 g total) by mouth daily.    fluticasone-salmeterol 115-21 mcg/act inhaler Inhale 2 puffs two (2) times daily.    gabapentin 300 mg capsule Take 1 capsule (300 mg total) by mouth three (3) times daily.    hydroCHLOROthiazide 12.5 MG tablet     losartan-hydroCHLOROthiazide 50-12.5 mg tablet Take 1 tablet by mouth daily.    montelukast 10 mg tablet Take 1 tablet (10 mg total) by mouth every evening.    Multiple Vitamin (MULTIVITAMIN ADULT PO) Take by mouth.    multivitamin tablet Take by mouth daily.    simvastatin 20 mg tablet Take 1 tablet (20 mg  total) by mouth at bedtime.     No current facility-administered medications for this visit.         Review of Systems:  Pertinent items are noted in HPI.    Objective:                  Gen -  NAD              HEENT - general inspection wnl              Neck - symmetric               Lungs - normal respiratory effort              Psych / Neuro - A&O x 4, normal affect / insight / judgement    LABS:  Reviewed today  Lab Results   Component Value Date    WBC 5.44 09/15/2021    HGB 13.2 (L) 09/15/2021    HCT 39.9 09/15/2021    MCV 102.3 (H) 09/15/2021    PLT 294 09/15/2021     Lab Results   Component Value Date    CREAT 1.13 09/15/2021    BUN 19 09/15/2021    NA 140 09/15/2021    K 4.5 09/15/2021    CL 104 09/15/2021    CO2 26 09/15/2021     Lab Results   Component Value Date    ALT 21 09/15/2021    AST 24 09/15/2021    ALKPHOS 59 09/15/2021    BILITOT 0.9 09/15/2021     Lab Results   Component Value Date    TSH 2.7 09/15/2021     Lab Results   Component Value Date    CHOL 128 09/15/2021    CHOLDLCAL 60 09/15/2021    CHOLHDL 54 09/15/2021    TRIGLY 70 09/15/2021     Lab Results   Component Value Date    HGBA1C 5.9 (H) 09/15/2021       Imaging Studies:   Imaging in last 90d: FL lumbar spine fluoroscopy    Result Date: 04/13/2022  FL LUMBAR SPINE DATE: 04/12/2022 10:10 AM HISTORY: Lumbar epidural.     IMPRESSION:  This procedure was performed in the Operating room. 0.59 minutes of fluoro time was utilized. Signed by: Evangeline Gula   04/13/2022 12:23 PM    MR lumbar spine wo contrast    Result Date: 02/28/2022  MR LUMBAR SPINE WO CONTRAST HISTORY: ''Neurologic deficit consistent with L-spine pathology, Low back pain without high energy trauma, Low back pain >= 6 weeks'' COMPARISON: None. TECHNIQUE: Multisequence MRI of the lumbar spine obtained without intravenous contrast.  FINDINGS: VERTEBRAE: Mild straightening of the normal lumbar lordosis. Normal vertebral body heights, alignment, and marrow signal of the lumbar spine. T2 signal hypointensity involving the left iliac bone, adjacent to the left sacroiliac joint (5-44), may represent sclerosis. DISCS: Mild lower lumbar spine disc height loss and desiccation, predominantly at L3-L4, L4-L5, and L5-S1. FACET JOINTS: Mild to moderate facet hypertrophy in the lower lumbar spine, as described below. SPINAL CANAL: Congenital narrowing of the spinal canal. Normal contour and position of the conus medullaris. No abnormal epidural fluid collection. PARASPINAL SOFT TISSUES: Bilateral renal cysts, the largest measuring up to 5.0 cm in AP dimension in the right lower pole (5-22). Findings by level: T12-L1: No significant focal disc protrusion, spinal canal stenosis, or neural foraminal stenosis. L1-L2: No significant focal disc protrusion, spinal canal stenosis, or neural foraminal stenosis. L2-L3: Minimal  circumferential disc bulge. No spinal canal stenosis or neural foraminal stenosis. L3-L4: Circumferential disc bulge, mild ligamentum flavum thickening, and moderate facet hypertrophy causes minimal stenosis of the spinal canal. No neural foraminal stenosis. L4-L5: Circumferential disc bulge, ligamentum flavum thickening, and moderate bilateral facet hypertrophy cause moderate spinal canal and bilateral neural foraminal stenosis. L5-S1: Circumferential disc bulge. No spinal canal stenosis or neural foraminal stenosis.     IMPRESSION: Multilevel degenerative disease. 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 No evidence of high-grade spinal canal or neuroforaminal stenosis. I, Avetis Kris Hartmann, MD,  have reviewed this study and agree with the report above. Signed by: Karie Schwalbe   02/28/2022 7:44 PM    XR lumbar spine ap+lat (2 views)    Result Date: 01/23/2022  XR LUMBAR SPINE AP LAT 2V REASON FOR STUDY: As given by the ordering provider ''lower back pain'' COMPARISON: None. FINDINGS: There is no compression fracture. There is mild diffuse degenerative disc space narrowing with moderate spur formation. There is no subluxation. The abdominal gas pattern is within normal limits and there is no urinary stone.     IMPRESSION: Mild diffuse degenerative discogenic disease. No compression fracture. Signed by: Evangeline Gula   01/23/2022 7:39 PM         Assessment & Plan:     Diagnoses and all orders for this visit:    Kidney cysts  Incidental finding. No fhx of kidney disease. Last eGFR 73 08/2021. Likely benign but will refer for further evaluation.  -     Referral to Nephrology           Follow-up:   Future Appointments   Date Time Provider Department Center   05/01/2022  3:00 PM Lonzo Candy, MD [facility]         Per H.R. [id number], Division B, Section 101 and 102, the Coronavirus Preparedness and Response Supplemental Appropriations Act of 2020, this visit was conducted via video and audio telecommunication with the patient?s verbal consent after verifying their name and date of birth. Vitals were not taken during this telemedicine appointment. .     The above plan of care, diagnosis, order, and follow-up were discussed with the patient. Questions related to this recommended plan of care were answered.           Patient Instructions   For nephrology appt, please call:    [facility]  [address], [address]  [place], [place] [address]  [phone]    --------------------------------------------------------------        Author:  [name] 04/18/2022 2:14 PM    11 minutes were spent personally by me today on this encounter which include today's pre-visit review of the chart, obtaining appropriate history, performing an evaluation, documentation and discussion of management with details supported within the note for today's visit. The time documented was exclusive of any time spent on the separately billed procedure.

## 2022-05-01 ENCOUNTER — Telehealth: Payer: PRIVATE HEALTH INSURANCE

## 2022-05-01 DIAGNOSIS — M48062 Spinal stenosis, lumbar region with neurogenic claudication: Secondary | ICD-10-CM

## 2022-05-01 NOTE — Progress Notes
Patient Consent to Telehealth   The patient agreed to participate in the video visit prior to joining the visit.         [facility]  Department of Anesthesiology & Pain Medicine        Consultation Note:  Date of Service: 05/01/2022  Primary Care Physician: Garret Reddish., MD    Referring Physician: Referral, Self    Chief Complaint:   No chief complaint on file.      History of Present Illness:  67 y.o. male with a past medical history significant for HTN presenting for continued management of buttock and leg pain. The patient reports they have had chronic buttock and leg pain for months. Today the pain is located in their right buttock with radiation into the right thigh and lateral calf. The pain is worse with standing, walking , and alleviated with sitting. The patient was referred to our spine clinic for possible interventional treatment options.     Interim History:  67 y.o. male with a past medical history significant for HTN presenting for continued management of buttock and leg pain. The patient reports he had about 30-40% relief following his first epidural injection. The patient still has pain in his right glut into his right lateral thigh/calf.        Current treatment:   Ibuprofen  Tylenol  Heat    Physical therapy: completed 6+ sessions in last 3 months, completes HEP      Injection History:  04/12/22: L4-L5 IL ESI 30-40% relief        CURES Report:  Last reviewed on 05/01/2022 and was appropriate.      PAST MEDICAL HISTORY:  Past Medical History:   Diagnosis Date    Asthma Recur this year    Childhood then stopped. Recurred this year    Chicken pox     Cyst of kidney, acquired     Epilepsy (HCC/RAF)     Gout of wrist 08/04/2014    Last Assessment & Plan:  History of. No recent flare up per patient. Counseled on diet modification. Increase water intake. Check uric acid level. Last Assessment & Plan:  Has been doing well on allopurinol without improvement    Hx of colonic polyp     Hyperlipidemia     Hypertension     IBS (irritable bowel syndrome)     diarrhea    Macrocytic anemia 07/15/2020    Obstructive sleep apnea 12/14/2019    no CPAP    Peptic ulcer disease     Prediabetes     Seasonal allergies     Skin cancer        PAST SURGICAL HISTORY:   Past Surgical History:   Procedure Laterality Date    COLONOSCOPY  2020    Normal colo at [facility]. Needs to bring in pathology report. Had polyps    EYE SURGERY Bilateral     Lasik    MOHS SURGERY      TONSILLECTOMY         CURRENT MEDICATIONS:  Current Outpatient Medications   Medication Sig    albuterol 90 mcg/act inhaler Inhale 2 puffs every four (4) hours as needed for Wheezing or Shortness of Breath.    allopurinol 300 mg tablet Take 1 tablet (300 mg total) by mouth daily.    cyanocobalamin 1000 mcg tablet Take 1 tablet (1,000 mcg total) by mouth daily.    diclofenac 75 mg EC tablet Take 1 tablet (75 mg  total) by mouth every twelve (12) hours as needed.    fish oil 1000 mg capsule Take 1 capsule (1 g total) by mouth daily.    fluticasone-salmeterol 115-21 mcg/act inhaler Inhale 2 puffs two (2) times daily.    gabapentin 300 mg capsule Take 1 capsule (300 mg total) by mouth three (3) times daily.    hydroCHLOROthiazide 12.5 MG tablet     losartan-hydroCHLOROthiazide 50-12.5 mg tablet Take 1 tablet by mouth daily.    montelukast 10 mg tablet Take 1 tablet (10 mg total) by mouth every evening.    Multiple Vitamin (MULTIVITAMIN ADULT PO) Take by mouth.    multivitamin tablet Take by mouth daily.    simvastatin 20 mg tablet Take 1 tablet (20 mg total) by mouth at bedtime.     No current facility-administered medications for this visit.         Physical Examination:   Vitals:   There were no vitals filed for this visit.       General: AAOx3, NAD and appropriately groomed                  Imaging Studies:     02/27/22 Lumbar MRI:           VERTEBRAE: Mild straightening of the normal lumbar lordosis. Normal vertebral body heights, alignment, and marrow signal of the lumbar spine. T2 signal hypointensity involving the left iliac bone, adjacent to the left sacroiliac joint (5-44), may   represent sclerosis.     DISCS: Mild lower lumbar spine disc height loss and desiccation, predominantly at L3-L4, L4-L5, and L5-S1.     FACET JOINTS: Mild to moderate facet hypertrophy in the lower lumbar spine, as described below.     SPINAL CANAL: Congenital narrowing of the spinal canal. Normal contour and position of the conus medullaris. No abnormal epidural fluid collection.     PARASPINAL SOFT TISSUES: Bilateral renal cysts, the largest measuring up to 5.0 cm in AP dimension in the right lower pole (5-22).     Findings by level:  T12-L1: No significant focal disc protrusion, spinal canal stenosis, or neural foraminal stenosis.  L1-L2: No significant focal disc protrusion, spinal canal stenosis, or neural foraminal stenosis.  L2-L3: Minimal circumferential disc bulge. No spinal canal stenosis or neural foraminal stenosis.  L3-L4: Circumferential disc bulge, mild ligamentum flavum thickening, and moderate facet hypertrophy causes minimal stenosis of the spinal canal. No neural foraminal stenosis.   L4-L5: Circumferential disc bulge, ligamentum flavum thickening, and moderate bilateral facet hypertrophy cause moderate spinal canal and bilateral neural foraminal stenosis.  L5-S1: Circumferential disc bulge. No spinal canal stenosis or neural foraminal stenosis.       Assessment:    Lumbar neurogenic claudication  L4-5 moderate stenosis     Impression:    67 y.o. male with a past medical history significant for HTN presenting for continued management of buttock and leg pain consistent with possible lumbar neurogenic claudication. The patient will continue conservative management and follow up in march for consideration of a repeat injection.           Treatment Recommendations:   1) Medical Modalities:  -Ctn Dicloflenac 75 mg BID PRN  -Ctn Gabapentin 300 mg TID  2) Interventional Modalities:   -Consider repeat right L4-5 interlaminar epidural steroid injection   3) Behavioral Medicine Modalities: None  4) Imaging: reviewed lumbar MRI  5) Other Modalities: Continue PT     A total of 21 minutes of time was spent on  this encounter including reviewing prior records, imaging and laboratory results, and explanation of diagnosis, prognosis, risks and benefits of treatments, EHR documentation, importance of instruction and compliance, education, counseling and coordination of all future care and a detailed question and answer session.     Lonzo Candy, MD  Assistant Clinical Professor  Interventional Spine & Pain Medicine   Department of Anesthesiology  [facility] [place], [place]

## 2022-06-09 MED ORDER — LOSARTAN POTASSIUM-HCTZ 50-12.5 MG PO TABS
1.0 | ORAL_TABLET | Freq: Every day | ORAL | 1 refills
Start: 2022-06-09 — End: ?

## 2022-06-11 MED ORDER — LOSARTAN POTASSIUM-HCTZ 50-12.5 MG PO TABS
1.0 | ORAL_TABLET | Freq: Every day | ORAL | 1 refills | Status: AC
Start: 2022-06-11 — End: ?

## 2022-06-19 ENCOUNTER — Ambulatory Visit: Payer: BLUE CROSS/BLUE SHIELD

## 2022-06-19 DIAGNOSIS — M48062 Spinal stenosis, lumbar region with neurogenic claudication: Secondary | ICD-10-CM

## 2022-06-19 NOTE — Progress Notes
[facility]  Department of Anesthesiology & Pain Medicine        Consultation Note:  Date of Service: 06/19/2022  Primary Care Physician: Garret Reddish., MD    Referring Physician: Lonzo Candy, MD    Chief Complaint:   Follow-up      History of Present Illness:  67 y.o. male with a past medical history significant for HTN presenting for continued management of buttock and leg pain. The patient reports they have had chronic buttock and leg pain for months. Today the pain is located in their right buttock with radiation into the right thigh and lateral calf. The pain is worse with standing, walking , and alleviated with sitting. The patient was referred to our spine clinic for possible interventional treatment options.     Interim History:  67 y.o. male with a past medical history significant for HTN presenting for continued management of buttock and leg pain. The patient reports he had about 70% relief with his last epidural. Overall his pain is mild and well controlled.      Current treatment:   Ibuprofen  Tylenol  Heat    Physical therapy: completed 6+ sessions in last 3 months, completes HEP      Injection History:  04/12/22: L4-L5 IL ESI 70% relief        CURES Report:  Last reviewed on 06/19/2022 and was appropriate.      PAST MEDICAL HISTORY:  Past Medical History:   Diagnosis Date    Asthma Recur this year    Childhood then stopped. Recurred this year    Chicken pox     Cyst of kidney, acquired     Epilepsy (HCC/RAF)     Gout of wrist 08/04/2014    Last Assessment & Plan:  History of. No recent flare up per patient. Counseled on diet modification. Increase water intake. Check uric acid level. Last Assessment & Plan:  Has been doing well on allopurinol without improvement    Hx of colonic polyp     Hyperlipidemia     Hypertension     IBS (irritable bowel syndrome)     diarrhea    Macrocytic anemia 07/15/2020    Obstructive sleep apnea 12/14/2019    no CPAP    Peptic ulcer disease     Prediabetes     Seasonal allergies     Skin cancer        PAST SURGICAL HISTORY:   Past Surgical History:   Procedure Laterality Date    COLONOSCOPY  2020    Normal colo at [facility]. Needs to bring in pathology report. Had polyps    EYE SURGERY Bilateral     Lasik    MOHS SURGERY      TONSILLECTOMY         CURRENT MEDICATIONS:  Current Outpatient Medications   Medication Sig    albuterol 90 mcg/act inhaler Inhale 2 puffs every four (4) hours as needed for Wheezing or Shortness of Breath.    allopurinol 300 mg tablet Take 1 tablet (300 mg total) by mouth daily.    cyanocobalamin 1000 mcg tablet Take 1 tablet (1,000 mcg total) by mouth daily.    diclofenac 75 mg EC tablet Take 1 tablet (75 mg total) by mouth every twelve (12) hours as needed.    fish oil 1000 mg capsule Take 1 capsule (1 g total) by mouth daily.    fluticasone-salmeterol 115-21 mcg/act inhaler Inhale 2 puffs two (2) times  daily.    gabapentin 300 mg capsule Take 1 capsule (300 mg total) by mouth three (3) times daily.    hydroCHLOROthiazide 12.5 MG tablet     LOSARTAN-HYDROCHLOROTHIAZIDE 50-12.5 mg tablet TAKE 1 TABLET BY MOUTH EVERY DAY    montelukast 10 mg tablet Take 1 tablet (10 mg total) by mouth every evening.    Multiple Vitamin (MULTIVITAMIN ADULT PO) Take by mouth.    multivitamin tablet Take by mouth daily.    simvastatin 20 mg tablet Take 1 tablet (20 mg total) by mouth at bedtime.     No current facility-administered medications for this visit.         Physical Examination:   Vitals:   Vitals:    06/19/22 0903   BP: 122/67   Pulse: 92   Resp: 18   Temp: 36.6 ?C (97.9 ?F)   SpO2: 98%          General: AAOx3, NAD and appropriately groomed                  Imaging Studies:     02/27/22 Lumbar MRI:           VERTEBRAE: Mild straightening of the normal lumbar lordosis. Normal vertebral body heights, alignment, and marrow signal of the lumbar spine. T2 signal hypointensity involving the left iliac bone, adjacent to the left sacroiliac joint (5-44), may   represent sclerosis.     DISCS: Mild lower lumbar spine disc height loss and desiccation, predominantly at L3-L4, L4-L5, and L5-S1.     FACET JOINTS: Mild to moderate facet hypertrophy in the lower lumbar spine, as described below.     SPINAL CANAL: Congenital narrowing of the spinal canal. Normal contour and position of the conus medullaris. No abnormal epidural fluid collection.     PARASPINAL SOFT TISSUES: Bilateral renal cysts, the largest measuring up to 5.0 cm in AP dimension in the right lower pole (5-22).     Findings by level:  T12-L1: No significant focal disc protrusion, spinal canal stenosis, or neural foraminal stenosis.  L1-L2: No significant focal disc protrusion, spinal canal stenosis, or neural foraminal stenosis.  L2-L3: Minimal circumferential disc bulge. No spinal canal stenosis or neural foraminal stenosis.  L3-L4: Circumferential disc bulge, mild ligamentum flavum thickening, and moderate facet hypertrophy causes minimal stenosis of the spinal canal. No neural foraminal stenosis.   L4-L5: Circumferential disc bulge, ligamentum flavum thickening, and moderate bilateral facet hypertrophy cause moderate spinal canal and bilateral neural foraminal stenosis.  L5-S1: Circumferential disc bulge. No spinal canal stenosis or neural foraminal stenosis.       Assessment:    Lumbar neurogenic claudication  L4-5 moderate stenosis     Impression:    67 y.o. male with a past medical history significant for HTN presenting for continued management of buttock and leg pain consistent with lumbar neurogenic claudication. The patient will continue conservative management and follow up in march for consideration of a repeat injection.           Treatment Recommendations:   1) Medical Modalities:  -Ctn Dicloflenac 75 mg BID PRN  -Ctn Gabapentin 300 mg TID  2) Interventional Modalities:   -Consider repeat right L4-5 interlaminar epidural steroid injection   3) Behavioral Medicine Modalities: None  4) Imaging: reviewed lumbar MRI  5) Other Modalities: Continue PT     A total of 30 minutes of time was spent on this encounter including reviewing prior records, imaging and laboratory results, and explanation of diagnosis, prognosis,  risks and benefits of treatments, EHR documentation, importance of instruction and compliance, education, counseling and coordination of all future care and a detailed question and answer session.     Lonzo Candy, MD  Assistant Clinical Professor  Interventional Spine & Pain Medicine   Department of Anesthesiology  [facility] [place], [place]

## 2022-07-09 ENCOUNTER — Ambulatory Visit: Payer: PRIVATE HEALTH INSURANCE

## 2022-07-09 ENCOUNTER — Ambulatory Visit: Payer: PRIVATE HEALTH INSURANCE | Attending: Student in an Organized Health Care Education/Training Program

## 2022-07-09 DIAGNOSIS — J4540 Moderate persistent asthma, uncomplicated: Secondary | ICD-10-CM

## 2022-07-09 MED ORDER — FLUTICASONE-SALMETEROL 250-50 MCG/ACT IN AEPB
2 | Freq: Two times a day (BID) | RESPIRATORY_TRACT | 2 refills | Status: AC
Start: 2022-07-09 — End: 2022-07-11

## 2022-07-09 NOTE — Patient Instructions
Try looking up symbicort, wixela, dulera to see if any of these are better covered by insurance    For pulmonology, I recommend:    [address], [address]  [place], [place] [address]  [phone] phone  [phone] fax  Drs. Verdie Drown, Nikita Jambulingam, [employer], [address], [place]ma [place], [place], [address], Dennie Ma[place]

## 2022-07-09 NOTE — Progress Notes
[facility] NOTE    PATIENT: Joseph Donovan  MRN: [id number]  DOB: May 06, 1955  DATE OF SERVICE: 07/09/2022    PRIMARY CARE PROVIDER: Garret Reddish., MD  REFERRING PROVIDER: No ref. provider found    Chief Complaint   Patient presents with    Asthma     Patient Active Problem List   Diagnosis    Gout of wrist    Hyperlipidemia    IBS (irritable bowel syndrome)    Low vitamin B12 level    Macrocytosis without anemia    Snores    Cyst of kidney, acquired    BMI 31.0-31.9,adult    Reactive airway disease with wheezing    Macrocytic anemia    Alcohol use    Prediabetes    Tremor of both hands    Obstructive sleep apnea    Epilepsy (HCC/RAF)    Hx of colonic polyp    Seasonal allergies    Skin cancer       Subjective:   Joseph Donovan is a 67 y.o. male with above PMH who presents for:    #Asthma  - on singulair, advair - initially got better but symptoms getting worse  - chest feels a little rough (like inhaling smoke in a bonfire)  - if he breathes too deeply will start coughing  - using albuterol more frequently but doesn't help every time  - slowly getting worse  - has been traveling, went to [place] recently and got a cold - since then, hasn't gone back to baseline  - wondering if there is alternative to advair d/t insurance    Past medical, surgical, family, and social history were reviewed and non-contributory except as noted in HPI.    Outpatient Medications Marked as Taking for the 07/09/22 encounter (Office Visit) with [name], MD, MS   Medication    albuterol 90 mcg/act inhaler    allopurinol 300 mg tablet    cyanocobalamin 1000 mcg tablet    diclofenac 75 mg EC tablet    fish oil 1000 mg capsule    gabapentin 300 mg capsule    hydroCHLOROthiazide 12.5 MG tablet    LOSARTAN-HYDROCHLOROTHIAZIDE 50-12.5 mg tablet    montelukast 10 mg tablet    Multiple Vitamin (MULTIVITAMIN ADULT PO)    multivitamin tablet    simvastatin 20 mg tablet    [DISCONTINUED] fluticasone-salmeterol 115-21 mcg/act inhaler       No Known Allergies    Review of Systems:  As per HPI    Objective:   BP 130/71  ~ Pulse 79  ~ Temp 36.4 ?C (97.6 ?F)  ~ Ht 6' 1'' (1.854 m)  ~ Wt 192 lb 6.4 oz (87.3 kg)  ~ SpO2 100%  ~ BMI 25.38 kg/m?   Physical Exam  Constitutional:       General: He is not in acute distress.     Appearance: He is not toxic-appearing.   HENT:      Head: Normocephalic and atraumatic.   Cardiovascular:      Rate and Rhythm: Normal rate and regular rhythm.   Pulmonary:      Effort: Pulmonary effort is normal.      Breath sounds: No wheezing, rhonchi or rales.   Skin:     General: Skin is warm and dry.   Neurological:      General: No focal deficit present.      Mental Status: He is alert and oriented to person, place, and  time.   Psychiatric:         Mood and Affect: Mood normal.         Behavior: Behavior normal.         Assessment/Plan:   Diagnoses and all orders for this visit:    Moderate persistent asthma, unspecified whether complicated  Patient reports worsening. Lung exam benign today. O2 sat wnl. Lower s/f exacerbation today, but would benefit from improved control.        -     increase advair to 250-50 2 puffs BID        -     Continue singulair daily, albuterol PRN  -     fluticasone-salmeterol 250-50 mcg/act diskus inhaler; Inhale 2 puffs two (2) times daily.  -     Referral to Pulmonary Disease  -     Consider switching to symbicort or dulera due to insurance coverage (pt to find out if any of these are better covered by insurance)  -     return precautions discussed        No follow-ups on file.    Author:  [name], MD, MS 07/09/2022 9:04 AM

## 2022-07-10 ENCOUNTER — Ambulatory Visit: Payer: PRIVATE HEALTH INSURANCE

## 2022-07-10 DIAGNOSIS — J4540 Moderate persistent asthma, uncomplicated: Secondary | ICD-10-CM

## 2022-07-10 MED ORDER — BUDESONIDE-FORMOTEROL FUMARATE 160-4.5 MCG/ACT IN AERO
2 | Freq: Two times a day (BID) | RESPIRATORY_TRACT | 3 refills | Status: AC
Start: 2022-07-10 — End: ?

## 2022-08-14 ENCOUNTER — Ambulatory Visit: Payer: PRIVATE HEALTH INSURANCE | Attending: Family

## 2022-08-29 DIAGNOSIS — Z85828 Personal history of other malignant neoplasm of skin: Secondary | ICD-10-CM

## 2022-08-29 DIAGNOSIS — I700 Atherosclerosis of aorta: Secondary | ICD-10-CM

## 2022-08-29 DIAGNOSIS — I10 Essential (primary) hypertension: Secondary | ICD-10-CM

## 2022-08-29 DIAGNOSIS — Z136 Encounter for screening for cardiovascular disorders: Secondary | ICD-10-CM

## 2022-08-29 DIAGNOSIS — E785 Hyperlipidemia, unspecified: Secondary | ICD-10-CM

## 2022-08-29 DIAGNOSIS — M10031 Idiopathic gout, right wrist: Secondary | ICD-10-CM

## 2022-08-29 DIAGNOSIS — G40309 Generalized idiopathic epilepsy and epileptic syndromes, not intractable, without status epilepticus: Secondary | ICD-10-CM

## 2022-08-29 DIAGNOSIS — J4540 Moderate persistent asthma, uncomplicated: Secondary | ICD-10-CM

## 2022-08-29 DIAGNOSIS — Z7185 Vaccine counseling: Secondary | ICD-10-CM

## 2022-08-29 DIAGNOSIS — M48062 Spinal stenosis, lumbar region with neurogenic claudication: Secondary | ICD-10-CM

## 2022-08-29 DIAGNOSIS — I771 Stricture of artery: Secondary | ICD-10-CM

## 2022-08-29 NOTE — Progress Notes
Patient Care Team:  Garret Reddish., MD as PCP - General (Internal Medicine)  Pcp, Unassigned Generic Myrlene Broker as PCP - Plan Assigned   Visit type: video telehealth   Patient Consent to Telehealth   The patient agreed to participate in the video visit prior to joining the visit.       Summary   Patient is seen for preventative care and chronic condition management.     Summary of Visit   Considerations for PCP:    1. Topic - request***   2.     New Conditions Identified: discussed with patient during visit   1. Atherosclerosis of Aorta   2. Tortuous Aorta    Referrals Ordered    []  Care Coordination (PCSW)   []  Behavioral Health (BHA)   []  Audiology   []  Retinal Screen    []  Ophthalmology   []  Diabetes Educator   []  Podiatry    Care Gaps Addressed   Annual Wellness Visit:   [x]  Already scheduled on 09/21/2022 with Dr. Imogene Burn.     Advance Directives:    [x]  Not on file. Discussed treatment options and how to complete form.       Vaccinations: advised on where to access vaccine   [x]  Pneumococcal    Vascular Screening: ordered this visit   [x]  AAA        Lifestyle Review   Diet   []  Low carb      []  Low fat  []  Low salt    []  Vegetarian/Vegan   []  No limitations []  Unable to specify  []  Declined to answer    Issues with access to healthy foods:   []  Yes  []  No  []  Declined to answer    Care Coordination:   []  Ambulatory Care Resources provided  []  Assistance Declined  []  None needed  []  Counseled re: healthy diet to including fruits, vegetables, whole grains, lean proteins, and oils, as well as avoiding high sodium levels, saturated or trans fats and added sugars    Exercise (frequency/time)    []  Cardiovascular:    []  Weight bearing:    []  Stretching:     []  Denies regular exercise   []  Declined to answer  []  Counseled re: need for at least 150 minutes moderate-intensity or 75 minutes of vigorous-intensity aerobic physical activity per week   []  Counseled re: need for strengthening activities at least twice per week Tobacco use  []  Current smoker   Interest in quitting: []  yes  []  no  []  Counseled re: smoking avoidance  []  Resources provided  [x]  Former smoker   PPD: 0.5   # years: 35   Year quit: 2009  []  Denies history of smoking  []  Declined to answer    Alcohol   Days of drinking/week: {hranumber:40296}  Drinks per episode: {hranumber:40296}   []  Counseled re: alcohol use  []  Declined to answer    Stress: Do you feel stress, tension, restlessness, nervousness, anxious, or unable to sleep because or mind is troubled?  []  Not at all  []  A little  []  To some extent  []  Rather much  []  Very much  []  Declined to answer    Care Coordination:   []   Ambulatory Care Resources provided  []   Assistance Declined  []   None needed    Sleep   []  Feels rested on awakening  []  Poor  []  Difficulty falling asleep  []  Difficulty staying asleep  []  Declined to answer  Care Coordination:   []   Ambulatory Care Resources provided  []   Assistance Declined  []   None needed    Social Interaction  Frequency of close friend/family interactions: {hrasocialization:39889}    Intimate Partner Violence  Any fear of partner?   []  Yes  []  No  []  N/A []  Declined to answer    Financial Resources   Any difficulty paying for the following items:     []  Food    []  Housing (rent/mortgage)    []  Medical care    []  Utility bills     []  No difficulty    []  Declined to answer     How many places have you lived in the last 12 months?    []  Declined to answer     Was there a time in the last 12 months where you did not have a steady place to sleep or had to sleep in a shelter?    []  Yes    []  No []  Declined to answer     Any difficulty having transportation to appointments or getting to meetings, work, or getting things needed for daily life?    []  Yes    []  No []  Declined to answer    Care Coordination:   []  Ambulatory Care Resources provided  []  Assistance Declined  []  None needed    Time spent discussing socioeconomic drivers of health: *** mins    Sensory Screening   Vision:   Last eye exam: Not Found in Not Found on Not Found.     Assistive devices:   []  Glasses []  Contacts []  No assistive device []  Declined to answer    Care Coordination:   []  Provided with insurance customer service number to identify covered providers ([employer]: [phone]; [employer]: [phone])  []  Assistance declined  []  Not applicable    Dental:  Last dental exam: ***    Barriers to care:   []  Cost []  Fear []  Time []  Other:  []  N/A    Assistive devices:   []  Dentures []  Partials []  Implants []  N/A    Care Coordination:   []  Ambulatory Care Resources provided  []  Assistance Declined  []  Not applicable    Hearing:   []  Decreased  []  Denies notable deficits []  Declined to answer    Last hearing exam: ***    Assistive devices:  []  Hearing aids []  Other:   []  N/A    Care Coordination:   []  Ambulatory Care Resources provided  []  Assistance Declined  []  Not applicable    Gait:   # falls in last 12 months: {hranumber:40296}  []  Injury sustained  []  No injury  []  Declined to answer    Perception of gait:   []  Steady []  Unsteady []  Declined to answer    Assistive devices used:    []  Wheelchair   []  Walker   []  Cane   []  Person-Assisted   []  N/A    Incontinence:   []  Urinary  []  Stool  []  Leakage  []  Denies  []  Declined to answer     Functional Screening   Assistance with day to day activities:    []  Yes, (assisted by: )  [x]  Denies  []  Declined to answer    Myrtis Ser Index of Independence in Activities of Daily Living: 6 pts  Bathing: Independent (1 pt)              Dressing: Independent (1 pt)  Toileting: Independent (1 pt)  Transferring: Independent (1 pt)  Continence: Independent (1 pt)          Feeding: Independent (1 pt)    Score          Indication          6         Full function  3-5        Moderate impairment  2 or less     Severe impairment    Gender  Score Indication          Women           0 Low function, dependent  6 High function, independent  Men  0 Low function, dependent 5 High function, independent    [place] Instrumental Activities of Daily Living Scale (IADL): 8pts  Telephone use:          [x]  1 pt    Looks up and dials numbers  []  1 pt    Dials few well-known numbers  []  1 pt    Answers telephone but does not dial  []  0 pt    Does not use telephone at all    Shopping              [x]  1 pt    Independent with all shopping needs  []  0 pt    Shops for small purchases  []  0 pt    Needs to be accompanied or unable    Food preparation          [x]  1 pt    Plans, prepares, and serves meals  []  0 pt    Prepares adequate meals if supplied with ingredients  []  0 pt    Prepares meals but does not maintain adequate diet  []  0 pt    Dependent for meal preparation,    Housekeeping          [x]  1 pt    Maintains house alone, occasional assistance  []  1 pt    Performs light tasks (e.g. dishwashing)  []  0 pt    Does not participate in any housekeeping tasks    Laundry              [x]  1 pt    Does personal laundry completely  []  1 pt    Launders small items  []  0 pt    All laundry must be done by others    Transportation          [x]  1 pt    Travels independently  []  1 pt    Arranges own travel but does not otherwise use public transportation  []  1 pt    Travels on public transportation when accompanied by another  []  0 pt    Travel limited to taxi or automobile with assistance of another  []  0 pt    Does not travel at all    Medication Responsibility      [x]  1 pt    Is responsible for taking medication correctly  []  0 pt    Takes responsibility if medication is prepared in advance in separate dosing (pill boxes)  []  0 pt    Is not capable of dispensing own medication    Finances    [x]  1 pt    Manages financial matters independently  []  0 pt    Manages day-to-day purchases but needs help with banking  []  0 pt    Is not capable of handling money  Gender    Score         Indication          Women   0        Low function, dependent      8        High function, independent  Men 0        Low function, dependent   5        High function, independent    Mental Health Screening    Memory:   Any concern re: memory:  []  Yes  []  Denies []  Declined to answer    Mini-Cog Total Score: ***refresh  No data recorded    PHQ-9 Total Score:      12/26/2018     8:58 AM   PHQ-9 Total Score   Total Score 2        GAD-7 Total Score:       No data to display                 DAST-10 Total Score:       No data to display                 AUDIT-C Total Score:       No data to display                    Goals of Care   Advance Care Planning     GOALS OF CARE & ADVANCE CARE PLANNING NOTE          Prognostic Awareness/Understanding of Treatment Intent:  ***   Does your patient understand the illness trajectory and treatment intent?  ''How do you feel things are going?''   Values/Goals:  *** ''Based on the understanding of your disease...''  - What is most important to you?  - What are your priorities right now?  - What are you hoping for?   - What are you most concerned about?    Examples: being at home, being mentally aware, being in control of decisions, not being a burden, achieving life goals, supporting my children, maintaining autonomy   Treatment Preferences:     ***   ''Based on your values, which treatments still appear to be worthwhile to achieve your goals''    ''What are treatments you would like to avoid if your quality of life worsens?''    Family/Social support:  ***      Surrogate Decision Maker:  Geralyn Flash (spouse)        Outcome and Plan  HRIDAY STAI has no Advance Directive or POLST in [facility] and has the capacity to identify a Runner, broadcasting/film/video. {Next steps in advance care planning-:40161}        7 minutes were spent on discussion of goals of care, counseling of the patient and/or family, and coordination of care.      Medical Condition Review   Medication adherence:    Missed dose    []  Often    []  Sometimes    [x]  Rarely    []  Never    []  Declined to answer     Causes of missed doses    []  Adverse effect of medication    []  Cost of medication    [x]  Forgetting    []  Other     []  N/A    Need for vaccination      DATA REVIEW  -Patient meets criteria for the following vaccinations:  Prevnar    PLAN:   [x]  Patient counseled re: indication and purpose vaccination; patient {select:30102}  []  Patient advised to get {hravax:37070} at PCP office  []  Patient advised to get {hravax:37070} at pharmacy     Need for AAA Screening      DATA REVIEW   Screening criteria (all required for coverage):   [x]  Male gender  [x]  Age 17 to 75 years  [x]  Smoked at least 100 cigarettes in lifetime    SUBJECTIVE  ROS  -reports smoking 0.5 PPD x 30 years, quit 2009    PLAN:   [x]  Patient counseled re: indication and purpose of AAA screening; patient {select:30102}  []  Patient counseled on the importance of smoking avoidance  []  Patient counseled on importance of smoking cessation  [x]  Follow up with PCP     Former smoker    []  New  []  Improved      []  Worse     [x]  Stable      []  Not Controlled    DATA REVIEW  Relevant labs, screening tests  AAA Screen pending  CT Lung Screen: ***      SUBJECTIVE  ROS  reports smoking 0.5 PPD x 30 years, quit 2009  -{hrareportdeny:38345} {hrapulmsx:37969}    PLAN  [x]  Patient counseled re: smoking cessation/avoidance, importance of annual CT lung screen  [x]  Monitor for new or worsening shortness of breath, coughing  [x]  Consider AAA screening, CT lung screening  [x]  Follow up with PCP    Hypertension    []  New  []  Improved      []  Worse     [x]  Stable      []   Not Controlled    DATA REVIEW:  Relevant labs, screening tests:   Diagnosis noted in Dr. Nicky Pugh notes on 01/24/2022  BP Readings from Last 3 Encounters:   07/09/22 130/71   06/19/22 122/67   04/12/22 146/78     Lab Results   Component Value Date    GFRESTNOAA 83 07/06/2020    GFRESTNOAA 83 12/26/2018    GFRESTNOAA 82 03/11/2018        Medications: Losartan -hydrochlorothiazide         SUBJECTIVE  ROS:    -{hrareportdeny:38345} home BP monitoring  -{hrareportdeny:38345} {hrahtn:37955}  -see LIFESTYLE REVIEW for diet, exercise, alcohol intake, and tobacco use    PLAN  [x]  Goal BP < 130 mm Hg systolic per ACC/AHA guidelines  [x]  Patient counseled re: diet, exercise, home BP monitoring, hydration, avoidance of NSAIDS, effect of alcohol intake and tobacco use  [x]  Continue medication  [x]  Continue checking BMP, urine alb/creat  [x]  Continue follow up with PCP  [x]  ER precautions given     Hyperlipidemia     []  New  []  Improved      []  Worse     [x]  Stable      []  Not Controlled    DATA REVIEW  Relevant labs, screening tests:   Diagnosis noted in Dr. Nicky Pugh notes on 01/24/2022  Lab Results   Component Value Date    CHOLHDL 54 09/15/2021    CHOLHDL 46 01/02/2021    CHOLHDL 47 07/06/2020     Lab Results   Component Value Date    CHOLDLCAL 60 09/15/2021    CHOLDLCAL 94 01/02/2021    CHOLDLCAL 74 07/06/2020     Lab Results   Component Value Date    TRIGLY 70 09/15/2021    TRIGLY 142 01/02/2021    TRIGLY 129 07/06/2020  2018 ACC/AHA guidelines recommends to continue statin treatment, as indicated based on pre-treatment risk assessment.    10-year ASCVD risk  cannot be calculated because at least one required variable is not available in [facility]. as of 9:49 AM on 08/29/2022  10-year ASCVD risk with optimal risk factors is 8.8%.    Medication: Simvastatin    SUBJECTIVE  ROS:    -see LIFESTYLE REVIEW for diet, exercise, alcohol intake    PLAN  [x]  Patient counseled re: importance of heart healthy diet, 150 minutes of cardiovascular exercise weekly  [x]  Continue medication  [x]  Follow up with PCP    Prediabetes    []  New  [x]  Improved      []  Worse     []  Stable      []  Not Controlled    DATA REVIEW  Relevant labs, screening tests:      Lab Results   Component Value Date    HGBA1C 5.9 (H) 09/15/2021    HGBA1C 6.3 (H) 01/02/2021    HGBA1C 6.3 (H) 07/06/2020       SUBJECTIVE  ROS:    -denies  home glucose monitoring  -see LIFESTYLE REVIEW for diet, exercise, alcohol intake    PLAN  [x]  Patient counseled re: monitoring carbohydrate intake, at least 150 minutes of moderate intensity exercise weekly, importance of foot care and monitoring  [x]  Continue to monitor A1C  [x]  Continue follow up with PCP    Moderate persistent Asthma   []  New  []  Improved      []  Worse     [x]  Stable      []  Not Controlled    DATA REVIEW  Relevant labs, screening tests:   Diagnosis noted in Dr. Nicky Pugh notes on 07/09/2022.    Medication: Albuterol     SUBJECTIVE  ROS:    -{hrareportdeny:38345} {hracopdsx:37067}    PLAN:   [x]  Patient counseled re: importance of staying up to date on recommended vaccinations, smoking avoidance, rest, high protein diet  [x]  Continue medication  [x]  Follow up with PCP       Idiopathic Gout of right wrist    []  New  []  Improved      []  Worse     [x]  Stable      []  Not Controlled    DATA REVIEW  Relevant labs, screening tests:   Diagnosis noted in Dr. Nicky Pugh notes on 01/24/2022   Latest Reference Range & Units 12/26/18 09:49 07/06/20 08:36   Uric Acid 3.4 - 8.8 mg/dL 5.0 4.8       Medication: Allopurinol    SUBJECTIVE  ROS:    -Reports last gout attack:     PLAN:   [x]  Patient counseled re: low purine diet  [x]  Consider monitoring uric acid   [x]  Continue medication  [x]  Follow up with PCP    Lumbar stenosis with neurogenic claudication    []  New  []  Improved      []  Worse     [x]  Stable      []  Not Controlled    DATA REVIEW  Relevant labs, screening tests:   Diagnosis noted in Pain management notes (Dr. Harrie Foreman) on 06/19/2022     MRI Lumbar spine done on 02/27/22 showed:       IMPRESSION:     Multilevel degenerative disease. 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 No evidence of high-grade spinal canal or neuroforaminal stenosis.  Medication: Diclofenac, Gabapentin    -Last visit with  NADER TONDRAVI, MD in Pain Management on 06/19/2022.      S/P epidural injection in the right L4-L5  on 04/12/2022.     SUBJECTIVE  ROS  -reports  chronic pain in buttocks and legs, worse with standing and walking, improved with sitting    PLAN:   [x]  Patient counseled re: weight loss, exercise, sleep management, stress reduction, meditation/relaxation  [x]  Continue medication  [x]  Follow up with pain management.    Tortuous Aorta   Atherosclerosis of Aorta   []  New  []  Improved      []  Worse     [x]  Stable     []  Not stable    DATA REVIEW  Relevant labs, screening tests  Chest xray done on 06/06/20 showed:   Tortuous, prominent thoracic aorta with atherosclerotic calcifications.     Medication: Losartan-hydrochlorothiazide, Simvastatin     SUBJECTIVE  ROS  -see LIFESTYLE REVIEW for diet and exercise regimen    PLAN  [x]  Patient counseled re: interplay between aging, elevated BP, elevated cholesterol, and elevated glucose resulting in vascular disease  [x]  Continue blood pressure, cholesterol, and glucose management  [x]  Patient counseled re: importance of diet and exercise  [x]  Follow up with PCP    Non intractable generalized idiopathic epilepsy without status epilepticus    []  New  []  Improved      []  Worse     [x]  Stable      []  Not Controlled    DATA REVIEW  Relevant labs, screening tests:   Diagnosis noted in Neurology (Dr. Orlean Patten and Dr. Rennis Chris) notes on 04/12/2022    EEG done on 09/22/20 showed:   Results:   The EEG was recorded in the awake and drowsy state.  Light sleep was not attained.   During the best awake state, the EEG showed a posterior dominant rhythm of 9 cycles per second.  During drowsiness, the posterior dominant rhythm decreased and the EEG showed diffuse 5-7 cycle per second activity. Vertex waves were seen at times. At no time during the EEG were any focal, lateralized, or epileptiform abnormalities seen in the recording. A brief asymmetric slow wave seen once over the right temporal region during drowsiness however it was not sufficient to call an abnormality.  Hyperventilation and photic stimulation produced no changes.   Impression: Normal EEG in the awake and drowsy states     MRI Brain done on 11/15/20 showed:   IMPRESSION:   No structural abnormality is identified to explain epilepsy. No significant neural change since prior exam.    -Last visit with  Velva Harman, MD in Neurology, Epilepsy on 04/12/2022.    SUBJECTIVE  ROS  -{hrareportdeny:38345} episodes of {hraepilepsysx:40375}  -reports last seizure : patient unable to recall exactly, but reports that his seizure started at age 47 and he said he hasn't had any after starting college.    PLAN:   [x]  Patient counseled re: lifestyle modification such as moderate alcohol intake, adequate sleep, and monitoring for seizure triggers  [x]  Follow up with PCP and neurology     History of skin cancer    []  New  []  Improved      []  Worse     [x]  Stable      []  Not Controlled    DATA REVIEW  Relevant labs, screening tests:   History of skin CA noted in Dermatology (Dr. Chipper Herb) notes on 01/26/2022 which stated '' Hx of skin cancer:  Hx of NMSC - previously managed by outsider Derm ''    -Last visit with  Tawni Pummel. Chipper Herb, MD in Dermatology on 01/26/2022.    SUBJECTIVE  ROS  -{hrareportdeny:38345} new or changing skin lesions    PLAN:   [x]  Patient counseled re: sun protection including sunscreens and sun-protective clothing   [x]  Continue skin checks with dermatology       Physical Exam     Limited PE due to nature of telehealth visit    VITAL SIGNS  There were no vitals taken for this visit.     PHYSICAL EXAM  Constitutional:  [x]  NAD []  Cachexia []  Obese    HEENT:  []  Cataracts []  Glasses []  Hearing aids  []  Deferred        Resp:  []  LCTA []  Rales  []  Wheezing    []  O2 use ( L/min)  [x]  Deferred            CV:  []  RRR   []  Irregular HR         []  JVD   []  Murmurs  [x]  Deferred    Extremities:  []  Warm   []  Pedal Pulses:           []  Pallor   []  Swelling:      []  Cap Refill >3 sec    [x]  Deferred        GI:  []  Soft, nontender     []  Tender              []  Distention          [x]  Deferred    MSK:  []  Steady gait  []  Unsteady gait  []  Arthritic nodes  []  Joint swelling  []  Amputations:  []  Strength: RUE _/5 LUE _/5 RLE _/5 LLE _/5  [x]  Deferred    GU:  []  CVAT           [x]  Deferred                Integ:  []  Intact   []  Ulcerations              []  Leg hair absent  []  Artificial openings          []  Bruising   []  Wound:  []  Thickened nails  [x]  Deferred    Neuro:  []  Mini-Cog:    []  Tremors              []  Microfilament:       []  Deferred    Psych:  []  PHQ-9:           []  GAD-7:       []  Withdrawn          []  Anxious/Restless  []  Deferred         Medical History     Past Medical History:   Diagnosis Date    Asthma Recur this year    Childhood then stopped. Recurred this year    Chicken pox     Cyst of kidney, acquired     Epilepsy (HCC/[facility])     Gout of wrist 08/04/2014    Last Assessment & Plan:  History of. No recent flare up per patient. Counseled on diet modification. Increase water intake. Check uric acid level. Last Assessment & Plan:  Has been doing well on allopurinol without improvement    Hx of colonic polyp     Hyperlipidemia     Hypertension  IBS (irritable bowel syndrome)     diarrhea    Macrocytic anemia 07/15/2020    Obstructive sleep apnea 12/14/2019    no CPAP    Peptic ulcer disease     Prediabetes     Seasonal allergies     Skin cancer       Past Surgical History:   Procedure Laterality Date    COLONOSCOPY  2020    Normal colo at [facility]. Needs to bring in pathology report. Had polyps    EYE SURGERY Bilateral     Lasik    MOHS SURGERY      TONSILLECTOMY             Allergies     No Known Allergies       Medications      No outpatient medications have been marked as taking for the 08/30/22 encounter (Appointment) with Lupita Raider., FNP.          Future Appointments      Future Appointments         Provider Department Visit Type    08/30/2022 10:00 AM Lupita Raider., FNP [facility] VIDEO VISIT - NEW    09/21/2022 3:00 PM [name], MD, MS  [facility] WELL ADULT (PHYSICAL)    10/10/2022 8:30 AM Altamese Dilling., MD [facility] NEW            Thank you for allowing me the opportunity to participate in the care of your patient.     Almond Lint NP     ***minutes were spent personally by me today on this encounter which include today's pre-visit review of the chart, obtaining appropriate history, performing an evaluation, documentation and discussion of management with details supported within the note for today's visit. The time documented was exclusive of any time spent on the separately billed procedure.

## 2022-08-30 ENCOUNTER — Ambulatory Visit: Payer: PRIVATE HEALTH INSURANCE | Attending: Family

## 2022-08-30 NOTE — Progress Notes
Patient Care Team:  Garret Reddish., MD as PCP - General (Internal Medicine)  Pcp, Unassigned Generic Joseph Donovan as PCP - Plan Assigned   Visit type: video telehealth   Patient Consent to Telehealth   The patient agreed to participate in the video visit prior to joining the visit.       Summary   Patient is seen for preventative care and chronic condition management.     Summary of Visit   Considerations for PCP:    1. Topic - request***   2.     New Conditions Identified: discussed with patient during visit   1. Atherosclerosis of Aorta   2. Tortuous Aorta    Referrals Ordered    []  Care Coordination (PCSW)   []  Behavioral Health (BHA)   []  Audiology   []  Retinal Screen    []  Ophthalmology   []  Diabetes Educator   []  Podiatry    Care Gaps Addressed   Annual Wellness Visit:   [x]  Already scheduled on 09/21/2022 with Dr. Imogene Burn.     Advance Directives:    [x]  Not on file. Discussed treatment options and how to complete form.       Vaccinations: advised on where to access vaccine   [x]  Pneumococcal    Vascular Screening: ordered this visit   [x]  AAA        Lifestyle Review   Diet   []  Low carb      []  Low fat  []  Low salt    []  Vegetarian/Vegan   []  No limitations []  Unable to specify  []  Declined to answer    Issues with access to healthy foods:   []  Yes  []  No  []  Declined to answer    Care Coordination:   []  Ambulatory Care Resources provided  []  Assistance Declined  []  None needed  []  Counseled re: healthy diet to including fruits, vegetables, whole grains, lean proteins, and oils, as well as avoiding high sodium levels, saturated or trans fats and added sugars    Exercise (frequency/time)    []  Cardiovascular:    []  Weight bearing:    []  Stretching:     []  Denies regular exercise   []  Declined to answer  []  Counseled re: need for at least 150 minutes moderate-intensity or 75 minutes of vigorous-intensity aerobic physical activity per week   []  Counseled re: need for strengthening activities at least twice per week Tobacco use  []  Current smoker   Interest in quitting: []  yes  []  no  []  Counseled re: smoking avoidance  []  Resources provided  [x]  Former smoker   PPD: 0.5   # years: 35   Year quit: 2009  []  Denies history of smoking  []  Declined to answer    Alcohol   Days of drinking/week: {hranumber:40296}  Drinks per episode: {hranumber:40296}   []  Counseled re: alcohol use  []  Declined to answer    Stress: Do you feel stress, tension, restlessness, nervousness, anxious, or unable to sleep because or mind is troubled?  []  Not at all  []  A little  []  To some extent  []  Rather much  []  Very much  []  Declined to answer    Care Coordination:   []   Ambulatory Care Resources provided  []   Assistance Declined  []   None needed    Sleep   []  Feels rested on awakening  []  Poor  []  Difficulty falling asleep  []  Difficulty staying asleep  []  Declined to answer  Care Coordination:   []   Ambulatory Care Resources provided  []   Assistance Declined  []   None needed    Social Interaction  Frequency of close friend/family interactions: {hrasocialization:39889}    Intimate Partner Violence  Any fear of partner?   []  Yes  []  No  []  N/A []  Declined to answer    Financial Resources   Any difficulty paying for the following items:     []  Food    []  Housing (rent/mortgage)    []  Medical care    []  Utility bills     []  No difficulty    []  Declined to answer     How many places have you lived in the last 12 months?    []  Declined to answer     Was there a time in the last 12 months where you did not have a steady place to sleep or had to sleep in a shelter?    []  Yes    []  No []  Declined to answer     Any difficulty having transportation to appointments or getting to meetings, work, or getting things needed for daily life?    []  Yes    []  No []  Declined to answer    Care Coordination:   []  Ambulatory Care Resources provided  []  Assistance Declined  []  None needed    Time spent discussing socioeconomic drivers of health: *** mins    Sensory Screening   Vision:   Last eye exam: Not Found in Not Found on Not Found.     Assistive devices:   []  Glasses []  Contacts []  No assistive device []  Declined to answer    Care Coordination:   []  Provided with insurance customer service number to identify covered providers ([employer]: [phone]; [employer]: [phone])  []  Assistance declined  []  Not applicable    Dental:  Last dental exam: ***    Barriers to care:   []  Cost []  Fear []  Time []  Other:  []  N/A    Assistive devices:   []  Dentures []  Partials []  Implants []  N/A    Care Coordination:   []  Ambulatory Care Resources provided  []  Assistance Declined  []  Not applicable    Hearing:   []  Decreased  []  Denies notable deficits []  Declined to answer    Last hearing exam: ***    Assistive devices:  []  Hearing aids []  Other:   []  N/A    Care Coordination:   []  Ambulatory Care Resources provided  []  Assistance Declined  []  Not applicable    Gait:   # falls in last 12 months: {hranumber:40296}  []  Injury sustained  []  No injury  []  Declined to answer    Perception of gait:   []  Steady []  Unsteady []  Declined to answer    Assistive devices used:    []  Wheelchair   []  Walker   []  Cane   []  Person-Assisted   []  N/A    Incontinence:   []  Urinary  []  Stool  []  Leakage  []  Denies  []  Declined to answer     Functional Screening   Assistance with day to day activities:    []  Yes, (assisted by: )  [x]  Denies  []  Declined to answer    Myrtis Ser Index of Independence in Activities of Daily Living: 6 pts  Bathing: Independent (1 pt)              Dressing: Independent (1 pt)  Toileting: Independent (1 pt)  Transferring: Independent (1 pt)  Continence: Independent (1 pt)          Feeding: Independent (1 pt)    Score          Indication          6         Full function  3-5        Moderate impairment  2 or less     Severe impairment    Gender  Score Indication          Women           0 Low function, dependent  6 High function, independent  Men  0 Low function, dependent 5 High function, independent    [place] Instrumental Activities of Daily Living Scale (IADL): 8pts  Telephone use:          [x]  1 pt    Looks up and dials numbers  []  1 pt    Dials few well-known numbers  []  1 pt    Answers telephone but does not dial  []  0 pt    Does not use telephone at all    Shopping              [x]  1 pt    Independent with all shopping needs  []  0 pt    Shops for small purchases  []  0 pt    Needs to be accompanied or unable    Food preparation          [x]  1 pt    Plans, prepares, and serves meals  []  0 pt    Prepares adequate meals if supplied with ingredients  []  0 pt    Prepares meals but does not maintain adequate diet  []  0 pt    Dependent for meal preparation,    Housekeeping          [x]  1 pt    Maintains house alone, occasional assistance  []  1 pt    Performs light tasks (e.g. dishwashing)  []  0 pt    Does not participate in any housekeeping tasks    Laundry              [x]  1 pt    Does personal laundry completely  []  1 pt    Launders small items  []  0 pt    All laundry must be done by others    Transportation          [x]  1 pt    Travels independently  []  1 pt    Arranges own travel but does not otherwise use public transportation  []  1 pt    Travels on public transportation when accompanied by another  []  0 pt    Travel limited to taxi or automobile with assistance of another  []  0 pt    Does not travel at all    Medication Responsibility      [x]  1 pt    Is responsible for taking medication correctly  []  0 pt    Takes responsibility if medication is prepared in advance in separate dosing (pill boxes)  []  0 pt    Is not capable of dispensing own medication    Finances    [x]  1 pt    Manages financial matters independently  []  0 pt    Manages day-to-day purchases but needs help with banking  []  0 pt    Is not capable of handling money  Gender    Score         Indication          Women   0        Low function, dependent      8        High function, independent  Men 0        Low function, dependent   5        High function, independent    Mental Health Screening    Memory:   Any concern re: memory:  []  Yes  []  Denies []  Declined to answer    Mini-Cog Total Score: ***refresh  No data recorded    PHQ-9 Total Score:      12/26/2018     8:58 AM   PHQ-9 Total Score   Total Score 2        GAD-7 Total Score:       No data to display                   AUDIT-C Total Score:       No data to display                    Goals of Care   Advance Care Planning     GOALS OF CARE & ADVANCE CARE PLANNING NOTE          Prognostic Awareness/Understanding of Treatment Intent:  ***   Does your patient understand the illness trajectory and treatment intent?  ''How do you feel things are going?''   Values/Goals:  *** ''Based on the understanding of your disease...''  - What is most important to you?  - What are your priorities right now?  - What are you hoping for?   - What are you most concerned about?    Examples: being at home, being mentally aware, being in control of decisions, not being a burden, achieving life goals, supporting my children, maintaining autonomy   Treatment Preferences:     ***   ''Based on your values, which treatments still appear to be worthwhile to achieve your goals''    ''What are treatments you would like to avoid if your quality of life worsens?''    Family/Social support:  Geralyn Flash (spouse)      Surrogate Decision Maker:  Geralyn Flash (spouse) - [phone]        Outcome and Plan  Joseph Donovan has no Advance Directive or POLST in [facility] and has the capacity to identify a surrogate decision maker. {Next steps in advance care planning-:40161}        7 minutes were spent on discussion of goals of care, counseling of the patient and/or family, and coordination of care.      Medical Condition Review   Medication adherence:    Missed dose    []  Often    []  Sometimes    [x]  Rarely    []  Never    []  Declined to answer     Causes of missed doses    []  Adverse effect of medication    []  Cost of medication    [x]  Forgetting    []  Other     []  N/A    Need for vaccination      DATA REVIEW  -Patient meets criteria for the following vaccinations: Prevnar    PLAN:   [x]  Patient counseled re: indication and purpose vaccination; patient {select:30102}  []  Patient advised to get  Prevnar at PCP office       Need for AAA Screening      DATA REVIEW   Screening criteria (all required for coverage):   [x]  Male gender  [x]  Age 25 to 79 years  [x]  Smoked at least 100 cigarettes in lifetime    SUBJECTIVE  ROS  -reports smoking 0.5 PPD x 30 years, quit 2009    PLAN:   [x]  Patient counseled re: indication and purpose of AAA screening; patient {select:30102}  []  Patient counseled on the importance of smoking avoidance  []  Patient counseled on importance of smoking cessation  [x]  Follow up with PCP       Former smoker    []  New  []  Improved      []  Worse     [x]  Stable      []  Not Controlled    DATA REVIEW  Relevant labs, screening tests  AAA Screen pending  CT Lung Screen: n/a  CT abdomen/pelvis 08/08/2022: ''there is no aortic aneurysm''      SUBJECTIVE  ROS  reports smoking 0.5 PPD x 30 years, quit 2009  -{hrareportdeny:38345} {hrapulmsx:37969}    PLAN  [x]  Patient counseled re: smoking cessation/avoidance  [x]  Monitor for new or worsening shortness of breath, coughing  [x]  Consider AAA screening[x]  Follow up with PCP      Hypertension    []  New  []  Improved      []  Worse     [x]  Stable      []   Not Controlled    DATA REVIEW:  Relevant labs, screening tests:   Diagnosis noted in Dr. Nicky Pugh notes on 01/24/2022  BP Readings from Last 3 Encounters:   07/09/22 130/71   06/19/22 122/67   04/12/22 146/78     Medications: Losartan -hydrochlorothiazide     SUBJECTIVE  ROS:    -{hrareportdeny:38345} home BP monitoring  -{hrareportdeny:38345} {hrahtn:37955}  -see LIFESTYLE REVIEW for diet, exercise, alcohol intake, and tobacco use    PLAN  [x]  Goal BP < 130 mm Hg systolic per ACC/AHA guidelines  [x]  Patient counseled re: diet, exercise, home BP monitoring, hydration, avoidance of NSAIDS, effect of alcohol intake and tobacco use  [x]  Continue medication  [x]  Continue checking BMP, urine alb/creat  [x]  Continue follow up with PCP  [x]  ER precautions given       Mixed Hyperlipidemia      []  New  []  Improved      []  Worse     [x]  Stable      []  Not Controlled    DATA REVIEW  Relevant labs, screening tests:   01/17/2008: Total cholesterol 227, LDL 154, HDL 43, Triglycerides 149    Lab Results   Component Value Date    CHOLHDL 54 09/15/2021    CHOLHDL 46 01/02/2021    CHOLHDL 47 07/06/2020     Lab Results   Component Value Date    CHOLDLCAL 60 09/15/2021    CHOLDLCAL 94 01/02/2021    CHOLDLCAL 74 07/06/2020     Lab Results   Component Value Date    TRIGLY 70 09/15/2021    TRIGLY 142 01/02/2021    TRIGLY 129 07/06/2020       2018 ACC/AHA guidelines recommends to continue statin treatment, as indicated based on pre-treatment risk assessment.    10-year ASCVD risk  cannot be calculated because at least one required variable is not available in [facility]. as of 4:32 PM on 08/30/2022  10-year ASCVD risk with optimal risk factors  is 8.8%.    Medication: Simvastatin    SUBJECTIVE  ROS:    -see LIFESTYLE REVIEW for diet, exercise, alcohol intake    PLAN  [x]  Patient counseled re: importance of heart healthy diet, 150 minutes of cardiovascular exercise weekly  [x]  Continue medication  [x]  Follow up with PCP      Prediabetes    []  New  []  Improved      []  Worse     [x]  Stable      []  Not Controlled    DATA REVIEW  Relevant labs, screening tests:      Lab Results   Component Value Date    HGBA1C 5.9 (H) 09/15/2021    HGBA1C 6.3 (H) 01/02/2021    HGBA1C 6.3 (H) 07/06/2020       SUBJECTIVE  ROS:    -denies  home glucose monitoring  -see LIFESTYLE REVIEW for diet, exercise, alcohol intake    PLAN  [x]  Patient counseled re: monitoring carbohydrate intake, at least 150 minutes of moderate intensity exercise weekly, importance of foot care and monitoring  [x]  Continue to monitor A1C  [x]  Continue follow up with PCP      Moderate persistent Asthma   []  New  []  Improved      []  Worse     [x]  Stable      []  Not Controlled    DATA REVIEW  Relevant labs, screening tests:   Diagnosis noted in Dr. Nicky Pugh notes on 07/09/2022.    Medication: Albuterol     SUBJECTIVE  ROS:    -{hrareportdeny:38345} {hracopdsx:37067}    PLAN:   [x]  Patient counseled re: importance of staying up to date on recommended vaccinations, smoking avoidance, rest, high protein diet  [x]  Continue medication  [x]  Follow up with PCP       Idiopathic Gout of right wrist    []  New  []  Improved      []  Worse     [x]  Stable      []  Not Controlled    DATA REVIEW  Relevant labs, screening tests:   Diagnosis noted in Dr. Nicky Pugh notes on 01/24/2022   Latest Reference Range & Units 12/26/18 09:49 07/06/20 08:36   Uric Acid 3.4 - 8.8 mg/dL 5.0 4.8       Medication: Allopurinol    SUBJECTIVE  ROS:    -Reports last gout attack:     PLAN:   [x]  Patient counseled re: low purine diet  [x]  Consider monitoring uric acid   [x]  Continue medication  [x]  Follow up with PCP    Lumbar stenosis with neurogenic claudication    []  New  []  Improved      []  Worse     [x]  Stable      []  Not Controlled    DATA REVIEW  Relevant labs, screening tests:   Diagnosis noted in Pain management notes (Dr. Harrie Foreman) on 06/19/2022     MRI Lumbar spine 02/27/2022:   Multilevel degenerative disease. 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 No evidence of high-grade spinal canal or neuroforaminal stenosis.    Medication: Diclofenac, Gabapentin    S/P epidural injection in the right L4-L5  on 04/12/2022.     -Last visit with  NADER TONDRAVI, MD in Pain Management on 06/19/2022.    SUBJECTIVE  ROS  -reports  chronic pain in buttocks and legs, worse with standing and walking, improved with sitting  PLAN:   [x]  Patient counseled re: weight loss, exercise, sleep management, stress reduction, meditation/relaxation  [x]  Continue medication  [x]  Follow up with pain management.      Tortuous Aorta; Atherosclerosis of Aorta   []  New  []  Improved      []  Worse     [x]  Stable     []  Not stable    DATA REVIEW  Relevant labs, screening tests  CT abdomen/pelvis 08/07/2012: Atherosclerotic calcifications are noted within the   abdominal aorta  Chest xray 06/06/2020: Tortuous, prominent thoracic aorta with atherosclerotic calcifications.     Medication: Losartan-hydrochlorothiazide, Simvastatin     SUBJECTIVE  ROS  -see LIFESTYLE REVIEW for diet and exercise regimen    PLAN  [x]  Patient counseled re: interplay between aging, elevated BP, elevated cholesterol, and elevated glucose resulting in vascular disease  [x]  Continue blood pressure, cholesterol, and glucose management  [x]  Patient counseled re: importance of diet and exercise  [x]  Follow up with PCP      Non intractable generalized idiopathic epilepsy without status epilepticus    []  New  []  Improved      []  Worse     [x]  Stable      []  Not Controlled    DATA REVIEW  Relevant labs, screening tests:   Diagnosis noted in Neurology (Dr. Orlean Patten and Dr. Rennis Chris) notes on 04/12/2022    EEG 09/22/20:    The EEG was recorded in the awake and drowsy state.  Light sleep was not attained.   During the best awake state, the EEG showed a posterior dominant rhythm of 9 cycles per second.  During drowsiness, the posterior dominant rhythm decreased and the EEG showed diffuse 5-7 cycle per second activity. Vertex waves were seen at times. At no time during the EEG were any focal, lateralized, or epileptiform abnormalities seen in the recording. A brief asymmetric slow wave seen once over the right temporal region during drowsiness however it was not sufficient to call an abnormality.  Hyperventilation and photic stimulation produced no changes.   Impression: Normal EEG in the awake and drowsy states     Brain MIR 11/15/2020: No structural abnormality is identified to explain epilepsy. No significant neural change since prior exam.    -Last visit with  Velva Harman, MD in Neurology, Epilepsy on 04/12/2022.    SUBJECTIVE  ROS  -{hrareportdeny:38345} episodes of {hraepilepsysx:40375}  -reports last seizure : patient unable to recall exactly, but reports that his seizure started at age 38 and he said he hasn't had any after starting college.    PLAN:   [x]  Patient counseled re: lifestyle modification such as moderate alcohol intake, adequate sleep, and monitoring for seizure triggers  [x]  Follow up with PCP and neurology       History of skin cancer    []  New  []  Improved      []  Worse     [x]  Stable      []  Not Controlled    DATA REVIEW  Relevant labs, screening tests:   History of skin CA noted in Dermatology (Dr. Chipper Herb) notes on 01/26/2022 which stated '' Hx of skin cancer: Hx of NMSC - previously managed by outsider Derm ''    -Last visit with  Tawni Pummel. Chipper Herb, MD in Dermatology on 01/26/2022.    SUBJECTIVE  ROS  -{hrareportdeny:38345} new or changing skin lesions    PLAN:   [x]  Patient counseled re: sun protection including sunscreens and sun-protective clothing   [x]  Continue skin  checks with dermatology       Physical Exam     Limited PE due to nature of telehealth visit    VITAL SIGNS  There were no vitals taken for this visit.     PHYSICAL EXAM  Constitutional:  [x]  NAD []  Cachexia []  Obese    HEENT:  []  Cataracts []  Glasses []  Hearing aids  []  Deferred        Resp:  []  LCTA []  Rales  []  Wheezing    []  O2 use ( L/min)  [x]  Deferred            CV:  []  RRR   []  Irregular HR         []  JVD   []  Murmurs  [x]  Deferred    Extremities:  []  Warm   []  Pedal Pulses:           []  Pallor   []  Swelling:      []  Cap Refill >3 sec    [x]  Deferred        GI:  []  Soft, nontender     []  Tender              []  Distention          [x]  Deferred    MSK:  []  Steady gait  []  Unsteady gait  []  Arthritic nodes  []  Joint swelling  []  Amputations:  []  Strength: RUE _/5 LUE _/5 RLE _/5 LLE _/5  [x]  Deferred    GU:  []  CVAT           [x]  Deferred                Integ:  []  Intact   []  Ulcerations              []  Leg hair absent  []  Artificial openings          []  Bruising   []  Wound:  []  Thickened nails  [x]  Deferred    Neuro:  []  Mini-Cog:    []  Tremors              []  Microfilament:       []  Deferred    Psych:  []  PHQ-9:           []  GAD-7:       []  Withdrawn          []  Anxious/Restless  []  Deferred         Medical History     Past Medical History:   Diagnosis Date    Asthma Recur this year    Childhood then stopped. Recurred this year    Chicken pox     Cyst of kidney, acquired     Epilepsy (HCC/RAF)     Gout of wrist 08/04/2014    Last Assessment & Plan:  History of. No recent flare up per patient. Counseled on diet modification. Increase water intake. Check uric acid level. Last Assessment & Plan:  Has been doing well on allopurinol without improvement    Hx of colonic polyp     Hyperlipidemia     Hypertension     IBS (irritable bowel syndrome)     diarrhea    Macrocytic anemia 07/15/2020    Obstructive sleep apnea 12/14/2019    no CPAP    Peptic ulcer disease     Prediabetes     Seasonal allergies     Skin cancer       Past Surgical History:  Procedure Laterality Date    COLONOSCOPY  2020    [facility] colo at [facility]. Needs to bring in pathology report. Had polyps    EYE SURGERY Bilateral     Lasik    MOHS SURGERY      TONSILLECTOMY             Allergies     No Known Allergies       Medications      No outpatient medications have been marked as taking for the 09/17/22 encounter (Appointment) with Marda Stalker., NP.          Future Appointments      Future Appointments         Provider Department Visit Type    09/17/2022 10:30 AM Marda Stalker., NP [facility] VIDEO VISIT -  RETURN    09/21/2022 3:00 PM [name], MD, MS [facility] WELL ADULT (PHYSICAL)    10/10/2022 8:30 AM Altamese Dilling., MD [facility] NEW            Thank you for allowing me the opportunity to participate in the care of your patient.     Almond Lint NP     ***minutes were spent personally by me today on this encounter which include today's pre-visit review of the chart, obtaining appropriate history, performing an evaluation, documentation and discussion of management with details supported within the note for today's visit. The time documented was exclusive of any time spent on the separately billed procedure.

## 2022-09-17 ENCOUNTER — Telehealth: Payer: PRIVATE HEALTH INSURANCE | Attending: Adult Health

## 2022-09-17 DIAGNOSIS — Z136 Encounter for screening for cardiovascular disorders: Secondary | ICD-10-CM

## 2022-09-17 DIAGNOSIS — M48062 Spinal stenosis, lumbar region with neurogenic claudication: Secondary | ICD-10-CM

## 2022-09-17 DIAGNOSIS — I700 Atherosclerosis of aorta: Principal | ICD-10-CM

## 2022-09-17 DIAGNOSIS — E782 Mixed hyperlipidemia: Secondary | ICD-10-CM

## 2022-09-17 DIAGNOSIS — J4540 Moderate persistent asthma, uncomplicated: Secondary | ICD-10-CM

## 2022-09-17 DIAGNOSIS — I10 Essential (primary) hypertension: Secondary | ICD-10-CM

## 2022-09-17 DIAGNOSIS — M10031 Idiopathic gout, right wrist: Secondary | ICD-10-CM

## 2022-09-17 DIAGNOSIS — Z85828 Personal history of other malignant neoplasm of skin: Secondary | ICD-10-CM

## 2022-09-17 DIAGNOSIS — Z87891 Personal history of nicotine dependence: Secondary | ICD-10-CM

## 2022-09-17 DIAGNOSIS — Z23 Encounter for immunization: Secondary | ICD-10-CM

## 2022-09-17 DIAGNOSIS — I771 Stricture of artery: Secondary | ICD-10-CM

## 2022-09-17 DIAGNOSIS — G40309 Generalized idiopathic epilepsy and epileptic syndromes, not intractable, without status epilepticus: Secondary | ICD-10-CM

## 2022-09-17 NOTE — Goals of Care
Advance Care Planning   GOALS OF CARE & ADVANCE CARE PLANNING NOTE          Prognostic Awareness/Understanding of Treatment Intent:  ''I think I'm doing good given my age''    Does your patient understand the illness trajectory and treatment intent?  ''How do you feel things are going?''   Values/Goals:  ''I like to travel and cycling so I want to be able to continue that.  It would be depressing to not be able.  It's important for my mind to be okay.  I don't want to be in a situation where I don't know what's going on.    ''If I'm physically deteriorating, I don't want to be a burden on others'' ''Based on the understanding of your disease...''  - What is most important to you?  - What are your priorities right now?  - What are you hoping for?   - What are you most concerned about?    Examples: being at home, being mentally aware, being in control of decisions, not being a burden, achieving life goals, supporting my children, maintaining autonomy   Treatment Preferences:   ''If I'm physically deteriorating, I don't want to be a burden on others''     ''Based on your values, which treatments still appear to be worthwhile to achieve your goals''    ''What are treatments you would like to avoid if your quality of life worsens?''    Family/Social support:  Joseph Donovan (spouse)      Surrogate Decision Maker:  Primary - Joseph Donovan (spouse) - [phone]  1st alternate - Joseph Donovan (daughter) - [phone]        Outcome and Plan  Joseph Donovan has no Advance Directive or POLST in [facility] and has the capacity to identify a surrogate decision maker. Next steps in advance care planning-: Patient identifies as Designated Surrogate :  Primary - Joseph Donovan (spouse) - [phone]  1st alternate - Joseph Donovan (daughter) - [phone]  Instructions for completing and uploading advance directive along with Advance Directive planning virtual community event invitation provided to patient through MyChart. Advised that document has to be notarized or signed by 2 witnesses. Patient may alternatively bring paper copy to PCP to be scanned into chart.   7 minutes were spent on discussion of goals of care, counseling of the patient and/or family, and coordination of care.    DO NOT WRITE ANY GOALS OF CARE TEXT BELOW THIS LINE

## 2022-09-17 NOTE — Patient Instructions
[employer] Service for covered optometrists: [phone]    [facility] for Abdominal Aorta Aneurysm screening: [phone]    ADVANCE CARE PLANNING:  Join [place] for our OPTIONAL virtual community session events to learn more about Advance Care Planning. Event is open to the public and attendees do not need to be [facility] patients. No charge.  We organize monthly zoom meeting to address any questions around advance directives and/or care planning.    Comptroller Education Sessions:    Wednesday, August 7th: 6:00-7:30pm  Wednesday, September 11th: 6:00-7:30pm  Wednesday, October 9th: 6:00-7:30pm      RSVP email: [email or web address]  Please review advance directives and consider completing document to include in your medical records. It can be accessed online by following the steps identified below. A copy of the form can also be found at your doctor's office.     DOWNLOADING ADVANCE DIRECTIVES    On the MyChart app:    1. Log into MyChart app.    2. Click ''Menu''.   3. Scroll down to blue section labeled ''My Record''.   4. Under ''My Record'', find button labeled ''Advanced Directives''. This will load the [employer].   5. On the [place] website, look to the right side of the screen in the light blue section with the title ''Related Links'' on the top right of the webpage.    6. Look on the bottom of the light blue section for [facility] Advance Directive''. There is an [place] version and a Spanish version. Click the link to download the PDF document.     On a computer:    1. Open your browser ([address], [place], [place], Catering [place]).   2. Go to the [facility] website for Advance Care Planning (ACP) Forms for English or Spanish forms: [email or web address]   3. Select your preferred form and click link. For example, you can click the link that reads ''[place] advance directive in English'' to download [facility]'s version of the Advance Directive form.    4. Alternatively, you can also access an Advance Directive in other languages from another website: www.CablePromo.it.        UPLOADING ADVANCE DIRECTIVES    On the MyChart app:    1. Log into MyChart app.    2. Click ''Menu''.   3. Scroll down to blue section labeled ''My Record''.   4. Under ''My Record'', find button labeled ''Advanced Directives''. This will load the [employer].   5. On the [place] website, scroll to the bottom of the page and click the link the reads ''Add a document''.    6. Select your Advanced Directive file from your phone. It will upload to your chart to be reviewed.       MINDFULNESS/MEDITATION    Disclaimer: [facility] does not recommend, endorse, sponsor, support, or have any affiliation with any of the agencies identified. [facility] has not conducted any type of review or due diligence of these agencies and is only listing publicly available contact information for your reference purposes only.  National     [facility])  Description: Vernia Buff was created to bring a renowned mental health research institution the ancient art of mindful awareness in a scientifically supported and rigorous form. The center offers classes and workshops to the general public, teaching the skills of mindfulness across the lifespan. It also offers mindfulness tools and classes to support mental health professionals.   E-mail: [email or web address]  Website: [email or web address]://www.page-[email or web address]     [employer] App  Description: With this easy-to-use app, you can practice mindfulness meditation anywhere, anytime with the guidance of the [facility] Mindful [facility]. The Mindful App is available to download on the [employer] and [employer].   Website: http://[email or web address]/    Headspace  Description: Through science-backed meditation and mindfulness tools, Headspace helps you create life-changing habits to support your mental health and find a healthier, happier you. Headspace offers a free membership for residents of [place].  Website: https://www.headspace.com/   For [place] Residents: https://www.headspace.com/[place]     Calm  Description: Calm is an app that provides tools for guided and unguided meditations, sleep, and relaxation. Calm allows members to personalize their experience with a goal to help improve health and happiness.   Website: GiftContent.co.nz     Healthy Minds Program App  Description: Healthy Minds Program is a free service that offers podcast-style lessons and both seated and active meditations that centers on four key ideas: Awareness, Connection, Insight, and Purpose.   Website: https://hminnovations.org/meditation-app     Insight Timer  Description: Insight Timer is a hub of resources that provides support with sleep, anxiety, and stress. Much of the content offered is free, and can be tailored to your needs.  Website: https://insighttimer.com/     Ten Percent Happier  Description: [employer] Happier offers free basic content, as well as their Flagship Meditation Course and free Meditation Challenges, to help with happiness, mindfulness, and sleep.  Website: AwaySpray.pl       Simple Habit  Description: Simple Habit is an app available to try for free that provides extensive medication content to help with wellness and sleep. Simple Habit specifically supports beginners and helps them get started.    Website: http://stephenson-sullivan.com/     Aura  Description: Michaell Cowing is an app great for those who have limited time for meditation. Aura is customizable and offers many micro-meditations that can be as short as 3-minutes long. The app helps users with stress, anxiety, sleep, mood tracking, and also offers a gratitude journal.   Website: https://www.aurahealth.io/     Breethe  Description: Natasha Mead is an app that was created to be an ?all-day and night? partner for anxiety, stress, and sleep. Natasha Mead is fully customizable and supports users in making it easy to fit meditation and wellness into their daily routine.   Website: ForumChats.es     The Mindfulness App  Description: With a focus on meditation, The Mindfulness App hopes to guide users into a more relaxed and healthier state of mind. The Mindfulness App offers a reminder function which helps users incorporate mindfulness into their daily routine.  Website: SocialDemand.no     Mindfulness Daily  Description: Mindfulness Daily provides quick, guided practices that can be ?sprinkled throughout your busy day? to help with reducing stress and anxiety, and improving sleep.   Website:  InsuranceOasis.com.br     Stop Breathe Think  Description: Stop Breathe Think offers personalized mindfulness practices that help with what you're feeling in that exact moment. This app can be very helpful for children who are new to mindfulness practices, as well as adults, and can help with decreasing anxiety and stress and building emotional strength.  Website: DetectiveLinks.com.cy     Smiling Mind  Description: Smiling Mind is an app tailored to support children and adolescents by providing access to life-long tools to support mental health in growing minds.   Website: https://www.smilingmind.com.au/smiling-mind-app     CBT Thought Diary  Description: The CBT Thought Diary is an app that  allows you to track your mood overtime.  Website: https://cbtthoughtdiary.com/     [place]    [facility] [place]  Description: Non-profit meditation community providing high-quality mindfulness and compassion practices. Offers classes, retreats, and special events that cater to both beginning meditators and those looking to deepen and sustain their practice. Offers secular, evidence-based mindfulness training and traditional Buddhist teachings within the Vipassana or Insight tradition. Offers donation-based classes.   Phone: [phone]  Website: ContactTape.nl     Unplug   Description: In-person and online classes featuring meditation, mindfulness, sound bath, breath work, crystal healing and aromatherapy classes. 2-week free intro offered; paid membership required after intro. Studio located at: [employer], [address], [place] [address].   Phone: [phone]  Email: [email or web address]   Website: [email or web address]://www.[email or web address]     [facility]  Description: Offers online and in-person meditation groups, classes and retreats. Also offers support groups on topics including creativity, healing from painful experiences, death & dying, and families & children group led by a school psychologist.   Phone: [phone]  Email: [email or web address]   Website: [email or web address].[email or web address]     [employer] Meditation  Description: A 501(c)3 non-profit organization dedicated to helping all people to recover their original nature. Offers meditation classes, guided live online and in-person, to help people find hope, inner peace, and true happiness.   Phone: [phone]  Email: [email or web address]   Website: [email or web address]://[email or web address].[email or web address]     [employer] Meditation  Description: A 501(c)3 non-profit organization that offers private guided meditation class, small-group meditation, and free online classes.   Phone: [phone]  Email: [email or web address]   Website: [email or web address]

## 2022-09-19 ENCOUNTER — Ambulatory Visit: Payer: PRIVATE HEALTH INSURANCE

## 2022-09-21 ENCOUNTER — Ambulatory Visit: Payer: PRIVATE HEALTH INSURANCE | Attending: Student in an Organized Health Care Education/Training Program

## 2022-09-26 ENCOUNTER — Ambulatory Visit: Payer: PRIVATE HEALTH INSURANCE | Attending: Student in an Organized Health Care Education/Training Program

## 2022-09-27 MED ORDER — ALBUTEROL SULFATE HFA 108 (90 BASE) MCG/ACT IN AERS
2 refills | 25.00000 days
Start: 2022-09-27 — End: ?

## 2022-09-28 MED ORDER — ALBUTEROL SULFATE HFA 108 (90 BASE) MCG/ACT IN AERS
2 refills | Status: AC
Start: 2022-09-28 — End: ?

## 2022-09-29 MED ORDER — ALLOPURINOL 300 MG PO TABS
300 mg | ORAL_TABLET | Freq: Every day | ORAL | 1 refills
Start: 2022-09-29 — End: ?

## 2022-09-29 MED ORDER — SIMVASTATIN 20 MG PO TABS
20 mg | ORAL_TABLET | Freq: Every evening | ORAL | 1 refills
Start: 2022-09-29 — End: ?

## 2022-10-01 MED ORDER — SIMVASTATIN 20 MG PO TABS
20 mg | ORAL_TABLET | Freq: Every evening | ORAL | 1 refills | Status: AC
Start: 2022-10-01 — End: ?

## 2022-10-01 MED ORDER — ALLOPURINOL 300 MG PO TABS
300 mg | ORAL_TABLET | Freq: Every day | ORAL | 1 refills | Status: AC
Start: 2022-10-01 — End: ?

## 2022-10-07 DIAGNOSIS — N281 Cyst of kidney, acquired: Secondary | ICD-10-CM

## 2022-10-07 DIAGNOSIS — M1000 Idiopathic gout, unspecified site: Secondary | ICD-10-CM

## 2022-10-07 DIAGNOSIS — I10 Essential (primary) hypertension: Secondary | ICD-10-CM

## 2022-10-07 DIAGNOSIS — J4540 Moderate persistent asthma, uncomplicated: Secondary | ICD-10-CM

## 2022-10-07 DIAGNOSIS — Z0000 Encounter for general adult medical examination without abnormal findings: Secondary | ICD-10-CM

## 2022-10-07 NOTE — Progress Notes
[facility] - MEDICARE [place] VISIT    PATIENT:  Joseph Donovan   MRN:  [id number]  DOB:  March 13, 1956  DATE OF SERVICE:  10/08/2022    PRIMARY CARE PROVIDER: Garret Reddish., MD  REFERRING PROVIDER: No ref. provider found    Chief Complaint   Patient presents with    Annual Exam       Subjective:   JAIMES ECKERT is a a 67 y.o. male here for an Annual Wellness Exam and a comprehensive review of chronic medical conditions.    Has Medicare, due for subsequent wellness visit.    Current exercise habits: daily, more since he retired  Dietary issues: Tries to eat well balanced meals    Hearing difficulties - no  Memory: no concerns  Safe in current home environment: yes    Current mobility:  no significant limitations but with chronic back pain, seeing Dr. Harrie Foreman     Patient Care Team:  Garret Reddish., MD as PCP - General (Internal Medicine)  Pcp, Unassigned Generic Myrlene Broker as PCP - Plan Assigned    Medications, Allergies, PMH, PSH, FamHx, ROS, Immunizations:    Patient Active Problem List    Diagnosis Date Noted    HTN (hypertension) 09/17/2022    Tortuous aorta (HCC/RAF) 09/17/2022    Atherosclerosis of aorta (HCC/RAF) 09/17/2022    Lumbar stenosis with neurogenic claudication 09/17/2022    Epilepsy (HCC/RAF) 04/27/2021    Hx of colonic polyp     Seasonal allergies     Skin cancer     Reactive airway disease with wheezing 07/15/2020    Macrocytic anemia 07/15/2020    Alcohol use 07/15/2020    Prediabetes 07/15/2020    Obstructive sleep apnea 12/14/2019     Formatting of this note might be different from the original.  Unable to tolerate mask      Cyst of kidney, acquired 12/22/2018    Low vitamin B12 level 10/31/2017     Last Assessment & Plan:   Repeat lab for monitoring.  Likely contributing to pt's tiredness.  Increase B Complex and B12 supplement empirically.      Macrocytosis without anemia 10/31/2017     Last Assessment & Plan:   MCV above 100.  History of normal MCV in the past  No anemia.  Hemodynamically stable.  Counseled on increase B complex supplement and B12 and folic acid empirically.  Repeat lab and monitor lab results.      Snores 10/31/2017     Last Assessment & Plan:   Concerned for obstructive apnea.  Pt wants to hold sleep study for now.  Counseled on weight loss by increase in exercise and decrease in carbohydrate and fatty food intake.  Bed elevation to 30 to 45 degree.  OTC oral mouth piece.      IBS (irritable bowel syndrome) 08/13/2017     Last Assessment & Plan:   Try METAMUCIL   Levsin prn  Call in a month, consider a CT      BMI 31.0-31.9,adult 04/19/2017     Last Assessment & Plan:   Counseled on weight loss by increase in exercise and decrease in carbohydrate and fatty food intake.      Tremor of both hands 11/18/2015     Last Assessment & Plan:   Formatting of this note might be different from the original.  Minimal tremor.  No focal neurological symptoms or weakness.  Check vitamin B12 and folate, and TSH and  T4 for evaluation.  Consider neurology referral evaluation if condition worsens.      Gout of wrist 08/04/2014     Last Assessment & Plan:   History of.  No recent flare up per patient.  Counseled on diet modification.  Increase water intake.  Check uric acid level.  Last Assessment & Plan:   Has been doing well on allopurinol without improvement      Hyperlipidemia 06/28/2006     Last Assessment & Plan:   The ASCVD Risk score Denman George DC Jr., et al., 2013) failed to calculate for the following reasons:    The valid total cholesterol range is 130 to 320 mg/dL  Stable on statin medication.  Counseled on weight loss by increase in exercise and decrease in carbohydrate and fatty food intake.  Repeat lab for monitoring.  Last Assessment & Plan:   Reviewed lab from 2014, well controlled.  Rec stop niacin and red yeast rice for primary prevention.  Conitnue simvastatin.  WIll recheck lipids at physical in 3 months       Past Medical History:   Diagnosis Date    Asthma Recur this year    Childhood then stopped. Recurred this year    Chicken pox     Cyst of kidney, acquired     Epilepsy (HCC/RAF)     Gout of wrist 08/04/2014    Last Assessment & Plan:  History of. No recent flare up per patient. Counseled on diet modification. Increase water intake. Check uric acid level. Last Assessment & Plan:  Has been doing well on allopurinol without improvement    Hx of colonic polyp     Hyperlipidemia     Hypertension     IBS (irritable bowel syndrome)     diarrhea    Macrocytic anemia 07/15/2020    Obstructive sleep apnea 12/14/2019    no CPAP    Peptic ulcer disease     Prediabetes     Seasonal allergies     Skin cancer      Past Surgical History:   Procedure Laterality Date    COLONOSCOPY  2020    Normal colo at [facility] Needs to bring in pathology report. Had polyps    EYE SURGERY Bilateral     Lasik    MOHS SURGERY      TONSILLECTOMY       Patient has no known allergies.  Outpatient Medications Marked as Taking for the 10/08/22 encounter (Office Visit) with [name], MD, MS   Medication    albuterol 90 mcg/act inhaler    ALLOPURINOL 300 mg tablet    budesonide-formoterol 160-4.5 mcg/act inhaler    cyanocobalamin 1000 mcg tablet    fish oil 1000 mg capsule    LOSARTAN-HYDROCHLOROTHIAZIDE 50-12.5 mg tablet    montelukast 10 mg tablet    Multiple Vitamin (MULTIVITAMIN ADULT PO)    SIMVASTATIN 20 mg tablet     Social History     Socioeconomic History    Marital status: Married   Tobacco Use    Smoking status: Former     Current packs/day: 0.00     Average packs/day: 0.5 packs/day for 30.0 years (15.0 ttl pk-yrs)     Types: Cigarettes     Start date: 03/26/1977     Quit date: 03/27/2007     Years since quitting: 15.5    Smokeless tobacco: Never   Vaping Use    Vaping status: Never Used   Substance and Sexual Activity  Alcohol use: Yes     Alcohol/week: 2.4 oz     Types: 4 Glasses of Wine (5 oz) per week     Comment: socially    Drug use: Not Currently    Sexual activity: Yes     Partners: Female     Birth control/protection: Male Condom, Withdrawal / Coitus Interruptus   Other Topics Concern    Do you exercise at least a day, 3 or more days a week? Yes    Do you follow a special diet? No    Lactose Free? Yes    Types of Exercise? (List in Comments) Yes     Comment: Gym, hiking, cycling   Social History Narrative    Married with step daughter.         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     Will be retiring.      Social Determinants of Health     Physical Activity: Sufficiently Active (09/17/2022)    Physical Activity     Days of Exercise per Week: 7 days     Minutes of Exercise per Session: 40 min   Stress: Stress Concern Present (09/17/2022)    Stress     Feeling of Stress : To some extent   Financial Resource Strain: Low Risk  (09/17/2022)    Financial Resource Strain     Difficulty of Paying Living Expenses: Not hard at all     Occupational History    Not on file     Family History   Problem Relation Age of Onset    Depression Mother     Meniere's disease Mother     Brain cancer Father 31    Cancer Father         Brain tumor (fatal)       Immunization History   Administered Date(s) Administered    COVID-19 Gala Murdoch Fall 2023 12y and up) PF, 50 mcg/0.5 mL 12/25/2021, 06/15/2022    COVID-19, mRNA, (Moderna) 100 mcg/0.5 mL 06/19/2019, 07/17/2019, 01/28/2020, 06/25/2020    COVID-19, mRNA, bivalent (Moderna) 50 mcg/0.5 mL (12y and up) 12/02/2020    Influenza vaccine IM quadrivalent (Afluria Quad) (PF) SYR (43 years of age and older) 02/07/2015, 01/27/2016, 12/13/2016    Td 10/05/2003    Tdap 04/12/2009, 10/15/2016    Typhoid Inactivated, ViCPs 10/05/2003    influenza vaccine IM cell culture quadrivalent (Flucelvax QUAD) (PF) SYR (76 months of age and older) 12/11/2019    influenza vaccine IM quadrivalent (Fluarix Quad) (PF) SYR (55 months of age and older) 03/11/2018, 12/26/2018    influenza vaccine IM quadrivalent high dose (Fluzone High Dose Quad) (PF) SYR (50 years of age and older) 12/11/2021    influenza, unspecified formulation 02/07/2015, 12/31/2015, 01/27/2016, 12/13/2016, 12/02/2020    zoster vac recomb adjuvanted (Shingrix) 03/29/2018, 05/31/2018     Health Maintenance   Topic Date Due    Advance Directive  Never done    Pneumococcal Vaccine (1 of 1 - PCV) Never done    AAA Screening  Never done    Preventive Wellness Visit  09/22/2022    Influenza Vaccine (1) 11/25/2022    Colorectal Cancer Screening  10/09/2023    Tdap/Td Vaccine (3 - Td or Tdap) 10/16/2026    Hepatitis B Screening  Completed  Hepatitis C Screening  Completed    Shingles (Shingrix) Vaccine  Completed    COVID-19 Vaccine(Tracks primary and booster doses, not sup/immunocomp)  Completed    HIV Screening  Completed    Statin prescribed for ASCVD Prevention or Treatment  Completed       Review of Systems:  Neg except as mentioned in HPI    Activities of Daily Living:    --ADLs: independent  --iADLs: independent      Fall Screen:        Fall in the past year?   [ ]   yes   [x]  no  Comments:     Fear of falling?    [ ]   yes   [x]  no   Comments:     Use of assistive device?   [ ]   yes   [x]  no  Comments:         Depression Screen:          03/11/2018     3:08 PM 12/26/2018     8:37 AM 12/26/2018     8:58 AM 06/06/2020     3:36 PM 09/21/2021     2:24 PM 09/17/2022    10:00 AM 10/08/2022    10:11 AM   Depression Screening (Patient Health Questionnaire PHQ)   PHQ-2: Feeling down, depressed, or hopeless No No  No Not at all  Not at all   PHQ-2: Little interest or pleassure in doing things No No  No Not at all  Not at all   Little interest or pleasure in doing things   Several days   Not at all    Feeling down, depressed, or hopeless   Not at all   Not at all    Trouble falling or staying asleep, or sleeping too much   Not at all   Several days    Feeling tired or having little energy   Several days   Not at all Poor appetite or overeating   Not at all   Not at all    Feeling bad about yourself - or that you are a failure or have let yourself or your family down   Not at all   Not at all    Trouble concentrating on things, such as reading the newspaper or watching television   Not at all   Not at all    Moving or speaking so slowly Or being so fidgety or restless   Not at all   Not at all    Thoughts that you would be better off dead, or of hurting yourself in some way   Not at all   Not at all    Total Score   2   1        Advanced Care Planning:    This patient has an advanced directive:  Yes [ ]   No [x]  Unknown [ ]   - will review  This patient's designated decision maker if or when patient lacks capacity is: wife  Code Status Specifics (designated by patient): TBD        Objective:   PHYSICAL EXAM:  BP 121/70  ~ Pulse 67  ~ Temp 37.1 ?C (98.7 ?F)  ~ Ht 6' 1'' (1.854 m)  ~ Wt 188 lb 6.4 oz (85.5 kg)  ~ SpO2 100%  ~ BMI 24.86 kg/m?     Gen: alert, well developed, well nourished, NAD  HEENT:  NC/AT, PERRL/EOMI, MMM, OP clear  Neck: supple  CV: RRR; normal S1/S2; no murmurs, rubs, gallops  Lungs: CTAB; no wheezes, rales, rhonchi  Abd: soft, NT/ND, +BS  Extr: no C/C/E  Neuro: A&O x 4, non-focal exam    Gait: normal gait and station  Get-Up-And-Go Test - normal      LABS:  Lab Results   Component Value Date    WBC 5.44 09/15/2021    HGB 13.2 (L) 09/15/2021    HCT 39.9 09/15/2021    MCV 102.3 (H) 09/15/2021    PLT 294 09/15/2021     Lab Results   Component Value Date    CREAT 1.13 09/15/2021    BUN 19 09/15/2021    NA 140 09/15/2021    K 4.5 09/15/2021    CL 104 09/15/2021    CO2 26 09/15/2021     Lab Results   Component Value Date    ALT 21 09/15/2021    AST 24 09/15/2021    ALKPHOS 59 09/15/2021    BILITOT 0.9 09/15/2021     Lab Results   Component Value Date    CHOL 128 09/15/2021    CHOLDLCAL 60 09/15/2021    CHOLHDL 54 09/15/2021    TRIGLY 70 09/15/2021     Lab Results   Component Value Date    TSH 2.7 09/15/2021     Lab Results   Component Value Date    HGBA1C 5.9 (H) 09/15/2021       Assessment & Plan:   Diagnoses and all orders for this visit:    Encounter for annual wellness visit (AWV) in Medicare patient  -     CBC & Auto Differential; Future  -     Comprehensive Metabolic Panel; Future  -     Hgb A1c; Future  -     Lipid Panel; Future  -     TSH with reflex FT4, FT3; Future  -     PSA,Screening; Future  -     Pt to get PCV20 at outside pharmacy    Moderate persistent asthma, unspecified whether complicated  - continue singulair 10 mg daily  - continue symbicort 2 puffs BID  - f/u pulmonology referral (scheduled 10/10/22)    Kidney cysts  Incidental finding on prior imaging.  - advised to schedule with nephrology as previously referred    Atherosclerosis of aorta (HCC/RAF)  ACC/AHA Cholesterol Management Guideline Recommendations:   High-intensity statin because of ASCVD diagnosis or LDL>=190mg /dL. Patient is only currently receiving a moderate-intensity statin.    ASCVD Risk Score    10-year ASCVD risk  N/A. Patient has ASCVD or LDL>=190 as of 10:37 AM on 10/08/2022.   10-year ASCVD risk with optimal risk factors  is 8.8%.   Values used to calculate ASCVD Risk Score    Age  22 y.o.     Gender  male   Race  European   HDL Cholesterol  54 mg/dL. (measured on 09/15/2021)   LDL Cholesterol  60 mg/dL. (measured on 09/15/2021)   Total Cholesterol  128 mg/dL. Cannot calculate risk because Total cholesterol <130 mg/dL. (measured on 09/15/2021)   Systolic Blood Pressure  121 mm Hg. (measured on 10/08/2022)   Blood Pressure Medication Present  Yes   Smoking Status  currently not a smoker   Diabetes Present  No   Click here for the [facility] ASCVD Cardiovascular Risk Estimator Plus tool Office manager).  Discussed recommendation for ASA, statin based on atherosclerosis. Pt already on statin with LDL at goal. Amenable to addition of  aspirin.   - continue simvastatin 20 mg daily  - start aspirin 81 mg daily    Primary hypertension  Well controlled.  - continue losartan-hydrochlorothiazide 50-12.5 mg daily    Idiopathic gout, unspecified chronicity, unspecified site  Well controlled.  - continue allopurinol 300 mg daily  -     Uric Acid; Future    Lumbar radiculopathy  F/b Dr. Harrie Foreman, s/p epidural steroid injection.    Diet Assessed [x]   [] Calories [] Fat [] Sugar/Sweets []  Sugar Sweetened Beverages/Juice [] Calcium [] Salt [] Protein [] Fruit/Vegetables  [] Other    Exercise Assessed [x]     Lifestyle Issues Assessed [x]   [] Stress [] Safety [] Seatbelts [] Violence    Immunization Status Determined [x]   [] Td [] Tdap [] Flu [] Hep A [] Hep B [] HPV [] Menactra [] Pneumovax [] Prevnar  []  Shingrix #1 []  Shingrix #2 (2-6 mo later) [] Other    Secondary Prevention Needed  [] Pap [] Mammogram [] Colonoscopy [] FIT     Labs: as ordered above  [] CBC [] Chem Panel [] TSH[] LIPIDS [] FBS [] HbA1c [] RUA [] Vitamin D [] PSA [] HCV Ab  STD Screening: [] HIV [] RPR [] HBV S Ag [] Gonorrhea [] Chlamydia    Osteoporosis Prevention  [x] Calcium 1200mg  in diet or vitamins  [x] Vit D 400-800u [x] Wt lifting 20 min 3x/week  [x] Weight Bearing 30 min most days     Osteoporosis Evaluation  [] Dexa (LOCATION) [] Ca++ [] Alk phos [] Phos [] Vit D [] PTH [] N-Telopeptide,urine    Carotid Stenosis F/up (Carotid Duplex) []     Advanced Care Planning  []  Advanced Health Care Directive, given to patient, form reviewed w pt, review completed form next visit    11. Current or Former Smoker:    []  Smoking Cessation Counseling    []  AAA screening (men, 65-75yo, 100+ cigs lifetime or 1st degree relative AAA)    []  Low-dose Chest CT qyr (30+ pack-year history, current or quit <=15 years        age 30-75yo or to 66 yo if in good health)    The above plan of care, diagnosis, order, and follow-up were discussed with the patient. Questions related to this recommended plan of care were answered.  The additional diagnoses assessed at this visit constituted a separate, identifiable service from the routine preventive health examination.    Patient Instructions   For spine surgery:  Please call [phone] to schedule., If patient desires to see the Neuro Spine surgeon, Dr. Lenna Gilford, and the Interventional Pain physician, Dr. Lonzo Candy, at the same time please advise the scheduler when booking the appointment.     -------------------------  For labs, you can walk into [facility] on the 1st floor of our building (no appointment needed) -      [facility]              [address]              [place], [place] [address]    Hours: Monday - Friday, 8 am - 5 pm    You can text [phone] with ''burltorrance'' ahead to get in line virtually to minimize waiting in line in person.  You will then receive text message to confirm your place in line (with estimate on wait time).  You also can download the ''QLess'' app, and it will go through similar process to get you in line virtually.  Please note that lines may be longer in the mornings.   ---------------------  For nephrology appt, please call:    [facility]  [address], [address]  [place], [place] [address]  [phone]  Dr.  Lance Muss, Dr. Sherrine Maples, Dr. Fenton Malling, Dr. Artist Beach    --------------------------------------------------------------    [name], MD, MS

## 2022-10-08 ENCOUNTER — Ambulatory Visit: Payer: BLUE CROSS/BLUE SHIELD | Attending: Student in an Organized Health Care Education/Training Program

## 2022-10-08 DIAGNOSIS — M5416 Radiculopathy, lumbar region: Secondary | ICD-10-CM

## 2022-10-08 NOTE — Patient Instructions
For spine surgery:  Please call [phone] to schedule., If patient desires to see the Neuro Spine surgeon, Dr. Lenna Gilford, and the Interventional Pain physician, Dr. Lonzo Candy, at the same time please advise the scheduler when booking the appointment.     -------------------------  For labs, you can walk into [facility] on the 1st floor of our building (no appointment needed) -      [facility]              [address]              [place], [place] [address]    Hours: Monday - Friday, 8 am - 5 pm    You can text [phone] with ''burltorrance'' ahead to get in line virtually to minimize waiting in line in person.  You will then receive text message to confirm your place in line (with estimate on wait time).  You also can download the ''QLess'' app, and it will go through similar process to get you in line virtually.  Please note that lines may be longer in the mornings.   ---------------------  For nephrology appt, please call:    [facility] Nephrology  [address]  [place], [place] [address]  [phone]  Dr. Lance Muss, Dr. Sherrine Maples, Dr. Fenton Malling, Dr. Artist Beach    --------------------------------------------------------------

## 2022-10-10 ENCOUNTER — Ambulatory Visit: Payer: PRIVATE HEALTH INSURANCE

## 2022-10-10 DIAGNOSIS — J4489 Asthma-COPD overlap syndrome (HCC/RAF): Secondary | ICD-10-CM

## 2022-10-10 DIAGNOSIS — Z720 Tobacco use: Secondary | ICD-10-CM

## 2022-10-10 DIAGNOSIS — R0602 Shortness of breath: Secondary | ICD-10-CM

## 2022-10-10 MED ORDER — PREDNISONE 10 MG PO TABS
ORAL_TABLET | 1 refills | Status: AC
Start: 2022-10-10 — End: ?

## 2022-10-10 NOTE — Consults
Joseph Royal, MD, MS  REASON FOR CONSULTATION:  Joseph Donovan is a 67 y.o. male  Chief Complaint   Patient presents with    New Consult    Asthma    Cough     Productive cough    Shortness of Breath     MEDICAL DECISION MAKING  Problem List as of 10/10/2022 Reviewed: 04/27/2021 10:48 AM by Velva Harman, MD      Alcohol use    Asthma-COPD overlap syndrome (HCC/RAF)  Physical exam is notable for some wheezing.  Spirometry is restrictive.  Chest radiograph unremarkable except for suggestion hyperinflation and blunting of the costophrenic angles which appears new compared to 2022 chest radiograph.  Patient given history of tobacco use will be placed on Spiriva 2.5 mcg, 2 puffs once daily.  Prednisone taper 30 mg 3 days 20 mg 3 days.  Obtain pulmonary function study.    Atherosclerosis of aorta (HCC/RAF)    BMI 31.0-31.9,adult    Cyst of kidney, acquired    Epilepsy (HCC/RAF)    Gout of wrist    HTN (hypertension)    Hx of colonic polyp    Hyperlipidemia    IBS (irritable bowel syndrome)    Low vitamin B12 level    Lumbar stenosis with neurogenic claudication    Macrocytic anemia    Macrocytosis without anemia    Obstructive sleep apnea    Prediabetes    Reactive airway disease with wheezing    Seasonal allergies    Skin cancer    Snores  Tobacco abuse  Greater than 20 pack-year history of tobacco use stopped smoking 10 years ago.  Schedule lung cancer screening.        Documentation of shared decision-making for enrollment in low-dose CT screening for lung cancer:   This patient meets eligibility criteria for screening (Age 85-80 years, Asymptomatic, Tobacco exposure of 30 pack-years or greater, Current or former smoker having quit 15 years or less ago)  Extensive counseling was provided regarding the benefits and harms of lung cancer screening, including over-diagnosis, false positives, total radiation exposure from subsequent imaging and potential unnecessary procedures/harms resulting from those false positive results.   The importance of adherence to the annual screening protocol, impact of comorbidities, and willingness to proceed with further resulting diagnostic testing were discussed.        Tortuous aorta (HCC/RAF)    Tremor of both hands     were spent personally by me today on this encounter which include today's pre-visit review of the chart, obtaining appropriate history, performing an evaluation, documentation and discussion of management with details supported within the note for today's visit. The time documented was exclusive of any time spent on the separately billed procedure.              HPI:  67 year old with a history asthma since childhood here for persistent cough.  He has had exacerbation of symptoms since December.  He had initially complained of shortness of breath, cough.  Was treated with antibiotics, inhaled bronchodilators.  Shortness of breath has resolved but he still has persistent cough.  The cough is productive of clear sputum.  No hemoptysis or chest pain.  He had been using his albuterol inhaler prior to exacerbation with good relief.  However he had had persistent worsening symptoms of the cough.  He had Symbicort which was subsequently increased to the higher level.  He feels somewhat better with resolution of the dyspnea.  However, the patient has persistent  cough.  It does not awaken him at night.  It occurs throughout the day.  No spontaneous triggers.  Not associated with meals.  No significant history allergic rhinitis.  Current Outpatient Medications   Medication Sig    albuterol 90 mcg/act inhaler INHALE 2 PUFFS EVERY FOUR HOURS AS NEEDED FOR WHEEZING OR SHORTNESS OF BREATH.    ALLOPURINOL 300 mg tablet take 1 tablet by mouth every day    aspirin 81 mg EC tablet Take 1 tablet (81 mg total) by mouth daily.    budesonide-formoterol 160-4.5 mcg/act inhaler Inhale 2 puffs two (2) times daily.    cyanocobalamin 1000 mcg tablet Take 1 tablet (1,000 mcg total) by mouth daily.    fish oil 1000 mg capsule Take 1 capsule (1 g total) by mouth daily.    LOSARTAN-HYDROCHLOROTHIAZIDE 50-12.5 mg tablet TAKE 1 TABLET BY MOUTH EVERY DAY    montelukast 10 mg tablet Take 1 tablet (10 mg total) by mouth every evening.    Multiple Vitamin (MULTIVITAMIN ADULT PO) Take by mouth.    SIMVASTATIN 20 mg tablet take 1 tablet by mouth everyday at bedtime    predniSONE 10 mg tablet 3 tabs once daily 3 days 2 tabs once daily 3 days.    tiotropium bromide monohydrate (SPIRIVA RESPIMAT) 2.5 mcg/act inhaler Inhale 2 puffs daily.     No current facility-administered medications for this visit.     PAST MEDICAL HISTORY  Patient Active Problem List   Diagnosis    Gout of wrist    Hyperlipidemia    IBS (irritable bowel syndrome)    Low vitamin B12 level    Macrocytosis without anemia    Snores    Cyst of kidney, acquired    BMI 31.0-31.9,adult    Reactive airway disease with wheezing    Macrocytic anemia    Alcohol use    Prediabetes    Tremor of both hands    Obstructive sleep apnea    Epilepsy (HCC/RAF)    Hx of colonic polyp    Seasonal allergies    Skin cancer    HTN (hypertension)    Tortuous aorta (HCC/RAF)    Atherosclerosis of aorta (HCC/RAF)    Lumbar stenosis with neurogenic claudication    Asthma-COPD overlap syndrome (HCC/RAF)         SOCIAL HISTORY:  Tobacco:  1/2 to 3/4 pack per day stopping 10 years ago    ROS:   HEENT: Denies headache blurred vision tinnitus  Neck: Denies pain, masses  Cardiac: Denies palpitations, valvular heart disease, rheumatic fever,known myocardial infarction   Pulmonary: See HPI  Gastrointestinal: Denies nausea, vomiting, hematemesis, melena  Genitourinary: Denies frequency, urgency, renal stones  Endocrine: Denies polyuria, polydipsia  Hematologic: Denies easy bruisability, bleeding  Neurologic: Denies seizure disorder, weakness, numbness, tingling  Psychiatric: Denies depression  Dermatologic: Denies rash, itching  Lymphatic: Denies swelling, lumps  Musculoskeletal denies arthralgias, arthritis  Allergic: denies anaphylaxis, choking    Physical Examination:  Last Recorded Vital Signs:    10/10/22 0816   BP: 126/74   Pulse: 64   Resp: 16   Temp: 36.5 ?C (97.7 ?F)   SpO2: 99%     General: WD/WN, breathing comfortably, NAD  HEENT: NC/AT,A anicteric sclera.  conjunctivae pink   Neck: Supple,no adenopathy, trachea is midline, no adenopathy  Lungs:  Scattered wheezes  CV: Regular rhythm, normal S1 and S2, no gallops or murmurs were discerned  Abd: Soft, Non tender to palpation.  Ext: No edema, cyanosis or  clubbing  Skin: Warm, dry,no rash  Neuro: Alert/oriented motor strength is symmetrical  Psych: Normal affect    Data and Radiographs personally reviewed by myself include:    Results for orders placed in visit on 06/06/20    XR chest pa+lat (2 views)    Narrative  XR CHEST PA LAT 2V    CLINICAL HISTORY: chronic cough since 03/2020.    COMPARISON: None.    FINDINGS:    Normal heart size. Tortuous, prominent thoracic aorta with atherosclerotic calcifications.  Mildly hyperinflated lungs. Patchy bibasilar opacities likely represent atelectasis. No definite consolidation. Diffuse bronchial wall thickening.  No pleural effusions. No pneumothorax.  No acute osseous abnormalities. Moderate degenerative changes of the thoracic spine.    Impression  Diffuse bronchial wall thickening, suggestive of underlying airways disease/inflammation. No evidence of pneumonia.        Signed by: Ledon Snare   06/07/2020 7:55 AM     Chest x-ray taken today shows hyperinflation.  Questionable blunting of the costophrenic angles compared to 06/07/2020.  Normal heart size.  Tortuous prominent aorta.  Improvement in left lower lobe atelectatic changes        Fractional excretion nitric oxide elevated 27     Spirometry is restrictive.  Forced vital capacity 68% predicted FEV1 74% predicted      Arlys John K. Modesto Charon

## 2022-10-15 ENCOUNTER — Ambulatory Visit: Payer: PRIVATE HEALTH INSURANCE

## 2022-10-16 ENCOUNTER — Ambulatory Visit: Payer: PRIVATE HEALTH INSURANCE

## 2022-10-16 DIAGNOSIS — R0602 Shortness of breath: Secondary | ICD-10-CM

## 2022-10-16 MED ADMIN — ALBUTEROL SULFATE HFA 108 (90 BASE) MCG/ACT IN AERS: 4 | RESPIRATORY_TRACT | @ 22:00:00 | Stop: 2022-10-16

## 2022-10-22 ENCOUNTER — Telehealth: Payer: PRIVATE HEALTH INSURANCE

## 2022-10-22 NOTE — Telephone Encounter
I left message with time,date and location of both video consult with NP Genelle Bal on 08/06 along with CT Low dose chest on 08/12 in [facility]. No copay listed for video consult.

## 2022-10-24 ENCOUNTER — Telehealth: Payer: PRIVATE HEALTH INSURANCE

## 2022-10-24 NOTE — Progress Notes
[facility]  Department of Anesthesiology & Pain Medicine        Consultation Note:  Date of Service: 10/24/2022  Primary Care Physician: [name], MD, MS    Referring Physician: Lonzo Candy, MD    Chief Complaint:   No chief complaint on file.    Interim History:  67 y.o. male with a past medical history significant for HTN presenting for continued management of buttock and leg pain. The patient reports his pain is returning. It is located in his lumbar low back with radiation into his thighs and gluts. He is interested in a repeat epidural injection which provided 5 months of 70% relief.     VAS 6    Current treatment:   Ibuprofen  Tylenol  Heat    Physical therapy: completed 6+ sessions in last 3 months, completes HEP      Injection History:  04/12/22: L4-L5 IL ESI 70% relief        CURES Report:  Last reviewed on 10/24/2022 and was appropriate.      PAST MEDICAL HISTORY:  Past Medical History:   Diagnosis Date    Asthma Recur this year    Childhood then stopped. Recurred this year    Asthma-COPD overlap syndrome (HCC/RAF) 10/10/2022    Chicken pox     Cyst of kidney, acquired     Epilepsy (HCC/RAF)     Gout of wrist 08/04/2014    Last Assessment & Plan:  History of. No recent flare up per patient. Counseled on diet modification. Increase water intake. Check uric acid level. Last Assessment & Plan:  Has been doing well on allopurinol without improvement    Hx of colonic polyp     Hyperlipidemia     Hypertension     IBS (irritable bowel syndrome)     diarrhea    Macrocytic anemia 07/15/2020    Obstructive sleep apnea 12/14/2019    no CPAP    Peptic ulcer disease     Prediabetes     Seasonal allergies     Skin cancer        PAST SURGICAL HISTORY:   Past Surgical History:   Procedure Laterality Date    COLONOSCOPY  2020    Normal colo at [facility]. Needs to bring in pathology report. Had polyps    EYE SURGERY Bilateral     Lasik    MOHS SURGERY      TONSILLECTOMY CURRENT MEDICATIONS:  Current Outpatient Medications   Medication Sig    albuterol 90 mcg/act inhaler INHALE 2 PUFFS EVERY FOUR HOURS AS NEEDED FOR WHEEZING OR SHORTNESS OF BREATH.    ALLOPURINOL 300 mg tablet take 1 tablet by mouth every day    aspirin 81 mg EC tablet Take 1 tablet (81 mg total) by mouth daily.    budesonide-formoterol 160-4.5 mcg/act inhaler Inhale 2 puffs two (2) times daily.    cyanocobalamin 1000 mcg tablet Take 1 tablet (1,000 mcg total) by mouth daily.    fish oil 1000 mg capsule Take 1 capsule (1 g total) by mouth daily.    LOSARTAN-HYDROCHLOROTHIAZIDE 50-12.5 mg tablet TAKE 1 TABLET BY MOUTH EVERY DAY    montelukast 10 mg tablet Take 1 tablet (10 mg total) by mouth every evening.    Multiple Vitamin (MULTIVITAMIN ADULT PO) Take by mouth.    predniSONE 10 mg tablet 3 tabs once daily 3 days 2 tabs once daily 3 days.    SIMVASTATIN 20 mg tablet take  1 tablet by mouth everyday at bedtime    tiotropium bromide monohydrate (SPIRIVA RESPIMAT) 2.5 mcg/act inhaler Inhale 2 puffs daily.     No current facility-administered medications for this visit.         Physical Examination:   Vitals:   There were no vitals filed for this visit.         General: AAOx3, NAD and appropriately groomed                  Imaging Studies:     02/27/22 Lumbar MRI:           VERTEBRAE: Mild straightening of the normal lumbar lordosis. Normal vertebral body heights, alignment, and marrow signal of the lumbar spine. T2 signal hypointensity involving the left iliac bone, adjacent to the left sacroiliac joint (5-44), may   represent sclerosis.     DISCS: Mild lower lumbar spine disc height loss and desiccation, predominantly at L3-L4, L4-L5, and L5-S1.     FACET JOINTS: Mild to moderate facet hypertrophy in the lower lumbar spine, as described below.     SPINAL CANAL: Congenital narrowing of the spinal canal. Normal contour and position of the conus medullaris. No abnormal epidural fluid collection.     PARASPINAL SOFT TISSUES: Bilateral renal cysts, the largest measuring up to 5.0 cm in AP dimension in the right lower pole (5-22).     Findings by level:  T12-L1: No significant focal disc protrusion, spinal canal stenosis, or neural foraminal stenosis.  L1-L2: No significant focal disc protrusion, spinal canal stenosis, or neural foraminal stenosis.  L2-L3: Minimal circumferential disc bulge. No spinal canal stenosis or neural foraminal stenosis.  L3-L4: Circumferential disc bulge, mild ligamentum flavum thickening, and moderate facet hypertrophy causes minimal stenosis of the spinal canal. No neural foraminal stenosis.   L4-L5: Circumferential disc bulge, ligamentum flavum thickening, and moderate bilateral facet hypertrophy cause moderate spinal canal and bilateral neural foraminal stenosis.  L5-S1: Circumferential disc bulge. No spinal canal stenosis or neural foraminal stenosis.       Assessment:    Lumbar neurogenic claudication  L4-5 moderate stenosis     Impression:    67 y.o. male with a past medical history significant for HTN presenting for continued management of buttock and leg pain consistent with lumbar neurogenic claudication. Given the patient's benefit with his last epidural injection, I recommended a repeat right L4-5 interlaminar epidural steroid injection.           Treatment Recommendations:   1) Medical Modalities:  -No changes  2) Interventional Modalities:   -Repeat right L4-5 interlaminar epidural steroid injection   3) Behavioral Medicine Modalities: None  4) Imaging: none  5) Other Modalities: Continue HEP    A total of 30 minutes of time was spent on this encounter including reviewing prior records, imaging and laboratory results, and explanation of diagnosis, prognosis, risks and benefits of treatments, EHR documentation, importance of instruction and compliance, education, counseling and coordination of all future care and a detailed question and answer session.     Lonzo Candy, MD  Assistant Clinical Professor  Interventional Spine & Pain Medicine   Department of Anesthesiology  [place] [facility] [place], [place]

## 2022-10-26 ENCOUNTER — Ambulatory Visit: Payer: PRIVATE HEALTH INSURANCE

## 2022-10-26 MED ORDER — SPIRIVA RESPIMAT 2.5 MCG/ACT IN AERS
2 | Freq: Every day | RESPIRATORY_TRACT | 11 refills | Status: AC
Start: 2022-10-26 — End: ?

## 2022-10-30 ENCOUNTER — Ambulatory Visit: Payer: PRIVATE HEALTH INSURANCE | Attending: Family

## 2022-10-30 MED ORDER — SYMBICORT 160-4.5 MCG/ACT IN AERO
3 refills | Status: AC
Start: 2022-10-30 — End: ?

## 2022-11-05 ENCOUNTER — Telehealth: Payer: PRIVATE HEALTH INSURANCE

## 2022-11-05 NOTE — Telephone Encounter
Call Back Request      Reason for call back: pt was calling to schedule his procedure with Dr. Harrie Foreman     Any Symptoms:  []  Yes  []  No      If yes, what symptoms are you experiencing:    Duration of symptoms (how long):    Have you taken medication for symptoms (OTC or Rx):      If call was taken outside of clinic hours:    [] Patient or caller has been notified that this message was sent outside of normal clinic hours.     [] Patient or caller has been warm transferred to the physician's answering service. If applicable, patient or caller informed to please call us back if symptoms progress.  Patient or caller has been notified of the turnaround time of 1-2 business day(s).

## 2022-11-05 NOTE — Telephone Encounter
Patient is not available on 8/15. Will call him back when I have another date.

## 2022-11-06 ENCOUNTER — Inpatient Hospital Stay: Payer: PRIVATE HEALTH INSURANCE

## 2022-11-06 DIAGNOSIS — Z720 Tobacco use: Secondary | ICD-10-CM

## 2022-11-13 ENCOUNTER — Ambulatory Visit: Payer: BLUE CROSS/BLUE SHIELD

## 2022-11-13 DIAGNOSIS — Z720 Tobacco use: Secondary | ICD-10-CM

## 2022-11-13 NOTE — Progress Notes
Altamese Dilling., MD        Joseph Donovan is a 67 y.o. male    Chief Complaint   Patient presents with    Results     CT && PFT results         ASSESSMENT AND PLAN  Problem List as of 11/13/2022 Reviewed: 04/27/2021 10:48 AM by Velva Harman, MD      Alcohol use    Asthma-COPD overlap syndrome (HCC/RAF)  PFT shows essentially normal gas exchange in spirometry.  Evidence of mild emphysema seen on CT lung cancer screening    Atherosclerosis of aorta (HCC/RAF)    BMI 31.0-31.9,adult    Cyst of kidney, acquired    Epilepsy (HCC/RAF)    Gout of wrist    HTN (hypertension)    Hx of colonic polyp    Hyperlipidemia    IBS (irritable bowel syndrome)    Low vitamin B12 level    Lumbar stenosis with neurogenic claudication    Macrocytic anemia    Macrocytosis without anemia    Obstructive sleep apnea    Prediabetes    Reactive airway disease with wheezing    Seasonal allergies    Skin cancer    Snores  Tobacco abuse  Rads 2 lung cancer screening.  Repeat 1 year    Tortuous aorta (HCC/RAF)    Tremor of both hands         HPI  Follow-up for this 67 year old gentleman with a history of tobacco abuse, COPD/asthma overlap syndrome.  He has been doing well with current regimen of Symbicort 160/4.5, 2 puffs twice daily and Spiriva.  No complaints of shortness of breath.  Decreased cough.      Current Outpatient Medications   Medication Sig    albuterol 90 mcg/act inhaler INHALE 2 PUFFS EVERY FOUR HOURS AS NEEDED FOR WHEEZING OR SHORTNESS OF BREATH.    ALLOPURINOL 300 mg tablet take 1 tablet by mouth every day    aspirin 81 mg EC tablet Take 1 tablet (81 mg total) by mouth daily.    budesonide-formoterol (SYMBICORT) 160-4.5 mcg/act inhaler INHALE 2 PUFFS TWO TIMES DAILY.    cyanocobalamin 1000 mcg tablet Take 1 tablet (1,000 mcg total) by mouth daily.    fish oil 1000 mg capsule Take 1 capsule (1 g total) by mouth daily.    LOSARTAN-HYDROCHLOROTHIAZIDE 50-12.5 mg tablet TAKE 1 TABLET BY MOUTH EVERY DAY    montelukast 10 mg tablet Take 1 tablet (10 mg total) by mouth every evening.    Multiple Vitamin (MULTIVITAMIN ADULT PO) Take by mouth.    SIMVASTATIN 20 mg tablet take 1 tablet by mouth everyday at bedtime    tiotropium bromide monohydrate (SPIRIVA RESPIMAT) 2.5 mcg/act inhaler Inhale 2 puffs daily.    predniSONE 10 mg tablet 3 tabs once daily 3 days 2 tabs once daily 3 days. (Patient not taking: Reported on 11/13/2022.)     No current facility-administered medications for this visit.           ROS:  HEENT:Denies headache, blurred vision, tinnitus, epistaxis  Cardiac: Denies palpitations, valvular heart disease, rheumatic fever  GI: Denies hematemesis, melena, hematochezia  GU: Denies hematuria, frequency, urgency  Musculoskeletal: Denies arthralgias. Arthritis  Endocrine: Denies polyuria, polydipsia  Neurologic: Denies numbness, tingling, weakness  Psychiatric: Denies depression       Physical examination:  Last Recorded Vital Signs:    11/13/22 1504   BP: 115/58   Pulse: 65   Temp: 36.2 ?C (97.2 ?F)  SpO2: 98%       HEENT: Anicteric sclera, conjunctiva pink  Neck: Supple, trachea midline, no adenopathy  Lungs:  Clear bilateral breath sounds    Cardiac: Regular rate and rhythm, normal S1 and S2  Abdomen: Soft, nontender  Extremities: No cyanosis, clubbing or edema  Neurologic: Alert, oriented, moves all extremities symmetrically  Dermatologic: No rash  Lymphatics: No palpable adenopathy    Pertinent labs and studies personally reviewed by myself:    Results for orders placed in visit on 10/10/22    XR chest pa+lat (2 views)    Narrative  XR CHEST PA LAT 2V    CLINICAL HISTORY: cough.    COMPARISON: 06/06/2020.    FINDINGS:    Cardiac silhouette top normal in size, query left atrial dilatation; additional enlarged appearance ascending aorta on lateral radiograph. Hyperinflation. Blunting costophrenic sulci may be secondary trace pleural fluid/thickening. Increased interstitial  prominence. No consolidation. No pneumothorax. Degenerative osseous changes.    Impression  Mild hyperinflation. Trace pleural fluid/thickening, correlate pulmonary vascular congestion. Query dilatation ascending thoracic aorta and left atrium, may obtain echocardiography as warranted.        Signed by: Rosine Beat   10/10/2022 4:16 PM     PFT 10/16/2022  FT-FVC, post BD  3.43 - 5.77 L 4.48   PFT-FEV1, post BD  2.56 - 4.35 L 3.46   PFT-FEV1/FVC, post BD  63.93 - 86.74 % 77.37   PFT-t exp, post BD  sec 12.58   PFT-DLCOhb, Hgb  14.00 - 17.50 g(Hb)/dL 09.81 Low    PFT-DLCO, pre BD  21.30 - 37.51 ml/(min*mmHg) 28.17   PFT-DLCOhb, pre BD  21.30 - 37.51 ml/(min*mmHg) 31.03   PFT-VA, pre BD  5.65 - 8.53 L 5.94   PFT-TLC (pleth), pre BD  6.21 - 9.40 L 6.72   PFT-FRC (pleth), pre BD  2.88 - 5.76 L 4.79   PFT-FVC, pre BD  3.43 - 5.77 L 4.48   PFT-FEV1, pre BD  2.56 - 4.35 L 3.32   PFT-FEV1/FVC, pre BD  63.93 - 86.74 % 74.18   PFT-FEF25-75%, pre BD  1.22 - 4.69 L/s 2.41   PFT-t exp, pre BD  sec 10.51   PFT-FVC, Ref. 4.59   PFT-FVC, LLN 3.43   PFT-FVC, pre BD, % Ref.  % 97.4 %   PFT-FVC, post BD, % Ref.  % 97.5 %   PFT-FEV1, Ref. 3.48   PFT-FEV1, LLN 2.56   PFT-FEV1, pre BD, % Ref.  % 95.4 %   PFT-FEV1, post BD, Z score -0.03   PFT-FEV1, post BD, % Ref.  % 99.6 %   PFT-FEV1/FVC, Ref. 77   PFT-FEV1/FVC, LLN 64   PFT-FEF25-75%, Ref. 2.67   PFT-FEF25-75%, LLN 1.22   PFT-FEF25-75%, pre BD, % Ref.  % 90.4 %   PFT-FEF25-75%, post BD, % Ref.  % 112.9 %   PFT-DLCO, pre BD, Z score -0.11   PFT-DLCOhb, pre BD, Z score 0.46   PFT-VA, pre BD, % Ref.  % 84.4   PFT-TLC (pleth), pre BD, % Ref.  % 86 %   PFT-Test start 10/16/2022  2:43 PM   PFT-Interpretation date 11/13/2022  1:06 PM                CT 11/06/2022    FINDINGS:     Indeterminate or Suspicious Lung Nodules (Category 3-4B): None.         Indeterminate/Non-actionable Nodules (Category 2):  Present.   Nodule #1: LLL pleural-attached solid micronodule (2-254)  Benign nodules (Category 1): Scattered bilateral calcified granulomas.     LUNG PARENCHYMA  Emphysema: Trace centrilobular emphysema in the upper lobes. Incidental LUL simple cyst.  Airways disease: Mild central bronchial wall thickening and luminal irregularity  Fibrosis: Moderate biapical pleuroparenchymal scarring     OTHER ANATOMIC REGIONS  Lymph Nodes: No supraclavicular, axillary, or intrathoracic adenopathy  Pleura: Normal  Cardiac: Normal heart size. Normal pericardium.. Mild coronary artery calcification in the LAD distribution.. Probable papillary muscle calcification. Calcific atherosclerosis of the thoracicoabdominal aorta     OTHER FINDINGS:  Liver: Occasional small hepatic lucencies left lobe of liver, too small to characterize, query cysts.  Osseous: Degenerative spondylosis of thoracic spine.        IMPRESSION:     1. Lung Cancer Screening: LLL solid micronodule, of no clinical suspicion. LungRADS Category 2, Benign appearing (non-actionable) nodule(s).   These types of nodules are commonly observed and require no immediate action. Current recommendations for   eligible high risk individuals (criteria below) are routine annual screening with low dose CT.Marland Kitchen

## 2022-11-15 ENCOUNTER — Ambulatory Visit: Payer: BLUE CROSS/BLUE SHIELD | Attending: Student in an Organized Health Care Education/Training Program

## 2022-11-16 ENCOUNTER — Ambulatory Visit: Payer: BLUE CROSS/BLUE SHIELD

## 2022-11-16 ENCOUNTER — Inpatient Hospital Stay
Admit: 2022-11-21 | Discharge: 2022-11-21 | Disposition: A | Discharge status: Home / Self Care | Payer: BLUE CROSS/BLUE SHIELD

## 2022-11-21 DIAGNOSIS — M5416 Radiculopathy, lumbar region: Secondary | ICD-10-CM

## 2022-11-21 MED ADMIN — LIDOCAINE HCL (PF) 1 % IJ SOLN: @ 15:00:00 | Stop: 2022-11-21 | NDC 63323049257

## 2022-11-21 MED ADMIN — SODIUM CHLORIDE 0.9 % IV SOLN: @ 15:00:00 | Stop: 2022-11-21 | NDC 00338004904

## 2022-11-21 MED ADMIN — TRIAMCINOLONE ACETONIDE 40 MG/ML IJ SUSP: @ 15:00:00 | Stop: 2022-11-21 | NDC 70121116801

## 2022-11-21 MED ADMIN — IOHEXOL 300 MG/ML IJ SOLN: @ 15:00:00 | Stop: 2022-11-21 | NDC 00407141310

## 2022-11-21 NOTE — Discharge Instructions
DISCHARGE INSTRUCTIONS  You have had LUMBAR EPIDURAL STEROID INJECTION      The following may occur:  Dizziness  Leg or arm weakness - you may need some assistance walking  Numbness and/or warmth in arms or legs  Pain at needle insertion site(s)  Bleeding       Contact your doctor if any of the following occur:  Fever of 101.5 or greater  Excessive bleeding     Shower the next day.  Please avoid going in a bath or pool for the next 3 days to avoid soaking the injection sites.      Resume all daily medications, normal diet today and daily activities   Please no showers until tomorrow and avoid going in a bath or pool for 3 days to avoid soaking the injection sites.        **Please note we use Chloroprep or Hibeclens to clean your skin prior to the procedure. If Chloroprep was used, there may be residual [place] solution left on your skin following the procedure. It is anti-bacterial, and will wash off in the shower.**

## 2022-11-21 NOTE — H&P
UPDATED H&P REQUIREMENT    For [place] [facility] and [facility]    WHAT IS THE STATUS OF THE PATIENT'S MOST CURRENT HISTORY AND PHYSICAL?   - The most current H&P was performed within the past 24 hours. No additional updated H&P documentation is necessary.     REFER TO MEDICAL STAFF POLICIES REGARDING PRE-PROCEDURE HISTORY AND PHYSICAL EXAMINATION AND UPDATED H&P REQUIREMENTS BELOW:    [place] [facility] and -[facility] Medical Staff Policy 200 - For Patients Undergoing Procedures Requiring Moderate or Deep Sedation, General Anesthesia or Regional Anesthesia    Contents of a History and Physical Examination (H&P):    The H&P shall consist of chief complaint, history of present illness, allergies and medications, relevant social and family history, past medical history, review of systems and physical examination, and assessment and plan appropriate to the patient's age.    For Patients Undergoing Procedures Requiring Moderate or Deep Sedation, General Anesthesia or Regional Anesthesia:    1. An H&P shall be performed within 24 hours prior to the procedure by a qualified member of the medical staff or designee with appropriate privileges, except as noted in item 2 below.    2. If a complete history and physical was performed within thirty (30) calendar days prior to the patient's admission to the Medical Center for elective surgery, a member of the medical staff assumes the responsibility for the accuracy of the clinical information and will need to document in the medical record within twenty-four (24) hours of admission and prior to surgery or major invasive procedure, that they either attest that the history and physical has been reviewed and accepted, or document an update of the original history and physical relevant to the patient's current clinical status.    3. Providing an H&P for  patients undergoing surgery under local anesthesia is at the discretion of the Attending Physician.     4. When a procedure is performed by a dentist, podiatrist or other practitioner who is not privileged to perform an H&P, the anesthesiologist's assessment immediately prior to the procedure will constitute the 24 hour re-assessment.The dentist, podiatrist or other practitioner who is not privileged to perform an H&P will document the history and physical relevant to the procedure.    5. If the H&P and the written informed consent for the surgery or procedure are not recorded in the patient's medical record prior to surgery, the operation shall not be performed unless the attending physician states in writing that such a delay could lead to an adverse event or irreversible damage to the patient.    6. The above requirements shall not preclude the rendering of emergency medical or surgical care to a patient in dire circumstances.

## 2022-11-21 NOTE — Op Note
Lumbar Translaminar Epidural Steroid Injection    Procedure Location:  [facility]  Date of Service :  11/21/2022  Pre-operative Diagnosis: Lumbosacral Radiculitis   Post-operative Diagnosis: Lumbosacral Radiculitis     Operation Title(s):  1. Right L4-L5 translaminar epidural steroid injection      2. Intraoperative fluoroscopy    Surgeon:   Dr. Lonzo Candy, MD    Anesthesia:   Local      Indications: The patient?s history and physical exam were reviewed. The risks, benefits and alternatives to the procedure were discussed, and all questions were answered to the patient?s satisfaction. The patient agreed to proceed, and written informed consent was obtained.    Procedure in Detail:  The patient was brought into the procedure room and placed in the prone position on the fluoroscopy table. Standard monitors were placed, and vital signs were observed throughout the procedure. The area of the lumbar spine was prepped with Chlor-Prep times three and draped in a sterile manner. The L4-L5 interspace was identified and marked under AP fluoroscopy. The skin and subcutaneous tissues in the area were anesthetized with 1% lidocaine. A 17-gauge Tuohy epidural needle was directed toward the interspace under fluoroscopic guidance until the ligamentum flavum was engaged. From this point, a loss of resistance technique with a glass syringe and saline was used to identify entrance of the needle into the epidural space. Once a good loss of resistance was obtained, negative aspiration was confirmed and 1 mL of contrast solution was injected. An appropriate epidurogram was noted.    Then, after negative aspiration, a solution consisting of 2-mL triamcinolone (40mg /mL) and 1-mL preservative-free saline was easily injected. The needle was removed with a 1% lidocaine flush. The patient?s back was cleaned and a bandage was placed over the site of needle insertion.    Disposition: The patient tolerated the procedure well, and there were no apparent complications. Vital signs remained stable throughout the procedure. The patient was taken to the recovery area where written discharge instructions for the procedure were given.       Lonzo Candy, MD  Assistant Clinical Professor  Interventional Spine & Pain Medicine   Department of Anesthesiology  [facility] [place], [place]

## 2022-11-21 NOTE — H&P
SURGICAL HISTORY AND PHYSICAL  PATIENT:  Joseph Donovan  MRN:  [id number]  DOB:  [birth year]  DATE OF SERVICE:  11/21/2022  TIME:  7:24 AM  DIAGNOSIS:  Lumbar Stenosis with neurogenic claudication  Procedure(s):  right Lumbar L4-L5 translaminar epidural steroid injection with fluoroscopy and Local        [x]   INFORMED CONSENT OBTAINED INCLUDING RISKS BENEFITS AND ALTERNATIVES    [x]   INFORMED CONSENT OBTAINED FOR SEDATION INCLUDING RISKS, BENEFITS, AND/OR ALTERNATIVES    []   INFORMED CONSENT FOR BLOOD TRANSFUIONS INCLUDING RISKS, BENEFITS, AND/OR ALTERANTIVES     CHIEF COMPLAINT:  pain    HISTORY OF PRESENT ILLNESS:  This is a 67 y.o. male with a history of pain.         PAST MEDICAL HISTORY:    Past Medical History:   Diagnosis Date    Asthma Recur this year    Childhood then stopped. Recurred this year    Asthma-COPD overlap syndrome (HCC/RAF) 10/10/2022    Chicken pox     Cyst of kidney, acquired     Epilepsy (HCC/RAF)     Gout of wrist 08/04/2014    Last Assessment & Plan:  History of. No recent flare up per patient. Counseled on diet modification. Increase water intake. Check uric acid level. Last Assessment & Plan:  Has been doing well on allopurinol without improvement    Hx of colonic polyp     Hyperlipidemia     Hypertension     IBS (irritable bowel syndrome)     diarrhea    Macrocytic anemia 07/15/2020    Obstructive sleep apnea 12/14/2019    no CPAP    Peptic ulcer disease     Prediabetes     Seasonal allergies     Skin cancer        PAST SURGICAL HISTORY:    Past Surgical History:   Procedure Laterality Date    COLONOSCOPY  2020    Normal colo at [facility]. Needs to bring in pathology report. Had polyps    EYE SURGERY Bilateral     Lasik    MOHS SURGERY      TONSILLECTOMY         MEDS:    No current facility-administered medications for this encounter.       FAMILY HISTORY:  Denies pertinent family history    SOCIAL HISTORY:  Denies pertinent social history    HISTORY OF BLEEDING:  denies    ALLERGIES:  No Known Allergies       AIRWAY ASSESSMENT +:  []  Yes^  [x]  No   Unable to visualize uvula  []  Yes^  [x]  No   C-spine precautions or unable to flex/extend neck  []  Yes^  [x]  No   Sleep apnea confirmed by sleep study or on CPAP at home  ASA CLASSIFICATION #:  []  Class I     A normal, healthy patient  [x]  Class II    A patient with mild systemic disease  []  Class III   A patient with severe systemic disease that limits                       activity but is not incapacitating  []  Class IV   A patient with an incapacitating systemic disease that                      is a constant threat to life  []  Class V  A moribund patient who is agonal, not expected to                       live 24 hours, with or without operation; always                       considered an emergency (E)  Follow each class with an (E) to denote emergency operation.  * Required physical exam elements for H&P  # Required exam elements for all cases with sedation without an anesthesiologist present  ^ Denotes extra caution required      REVIEW OF SYSTEMS:     General ROS: negative for - chills, fatigue or fever  Psychological ROS: negative for - anxiety, behavioral disorder or concentration difficulties  Respiratory ROS: no cough, shortness of breath, or wheezing  Cardiovascular ROS: no chest pain or dyspnea on exertion  Gastrointestinal ROS: no abdominal pain, change in bowel habits, or black or bloody stools  Genito-Urinary ROS: no dysuria, trouble voiding, or hematuria  Musculoskeletal ROS: positive for - pain  Neurological ROS: no TIA or stroke symptoms     PHYSICAL EXAMINATION:  Last Recorded Vital Signs:    11/21/22 0646   BP: 157/66   Pulse: 75   Resp: 18   Temp: 36.8 ?C (98.2 ?F)   SpO2: 98%     GENERAL:  alert, well appearing, and in no distress  HEENT:  Normal, araumatic, normocephalic  HEART:  regular rate and rhythm, no murmurs  LUNGS:  clear to auscultation, no wheezes or rales, unlabored breathing  ABDOMEN:  soft, nontender, nondistended, no masses or organomegaly  EXTREMITIES:  warm and dry, no edema, normal strength, tone, and muscle mass  GENITALIA:  deferred  NEURO:  gross motor exam normal by observation, strength normal and symmetric     ASSESSMENT & PLAN:     1.  Joseph Donovan is a 67 y.o. male with a diagnosis of pain who presents for interventional pain procedure.  2.  Risks and benefits of the procedure were reviewed with the patient/family and all questions have been answered.  The patient/family has agreed to proceed with the scheduled procedure.  3.  The patient/family has signed the appropriate consent documents.

## 2022-12-11 ENCOUNTER — Ambulatory Visit: Payer: PRIVATE HEALTH INSURANCE

## 2022-12-27 MED ORDER — LOSARTAN POTASSIUM-HCTZ 50-12.5 MG PO TABS
1.0 | ORAL_TABLET | Freq: Every day | ORAL | 1 refills | Status: AC
Start: 2022-12-27 — End: ?

## 2023-01-31 ENCOUNTER — Ambulatory Visit: Payer: PRIVATE HEALTH INSURANCE | Attending: Student in an Organized Health Care Education/Training Program

## 2023-01-31 DIAGNOSIS — L821 Other seborrheic keratosis: Secondary | ICD-10-CM

## 2023-01-31 DIAGNOSIS — L814 Other melanin hyperpigmentation: Secondary | ICD-10-CM

## 2023-01-31 DIAGNOSIS — D229 Melanocytic nevi, unspecified: Secondary | ICD-10-CM

## 2023-01-31 DIAGNOSIS — Z85828 Personal history of other malignant neoplasm of skin: Secondary | ICD-10-CM

## 2023-01-31 DIAGNOSIS — D1801 Hemangioma of skin and subcutaneous tissue: Secondary | ICD-10-CM

## 2023-01-31 DIAGNOSIS — L308 Other specified dermatitis: Secondary | ICD-10-CM

## 2023-01-31 MED ORDER — TRIAMCINOLONE ACETONIDE 0.1 % EX CREA
Freq: Two times a day (BID) | TOPICAL | 1 refills | Status: AC
Start: 2023-01-31 — End: ?

## 2023-01-31 NOTE — Progress Notes
Dermatology Clinic Note  ?  Chief complaint:   Chief Complaint   Patient presents with    General Skin Check     Referring provider: Garret Reddish., MD    ?History of present illness:  Joseph Donovan, a 67 y.o. male presenting for FBSE    Rash of the R arm under watch, improving but still with mild persistence  Does not have rash on the L arm when switching watch placement     No painful, bleeding, rapidly changing lesions  Patient denies lesions of concern    Using SPF 50+ daily    Hx of skin cancer: Hx of NMSC - previously managed by outside Derm    ?PMH/Meds/All/Soc/Fam hx: Reviewed in [facility]     No new changes to PMH/Meds/All/Soc/Fam hx - please refer to today's intake form.  Patient Active Problem List   Diagnosis    Gout of wrist    Hyperlipidemia    IBS (irritable bowel syndrome)    Low vitamin B12 level    Macrocytosis without anemia    Snores    Cyst of kidney, acquired    BMI 31.0-31.9,adult    Reactive airway disease with wheezing    Macrocytic anemia    Alcohol use    Prediabetes    Tremor of both hands    Obstructive sleep apnea    Epilepsy (HCC/RAF)    Hx of colonic polyp    Seasonal allergies    Skin cancer    HTN (hypertension)    Tortuous aorta (HCC/RAF)    Atherosclerosis of aorta (HCC/RAF)    Lumbar stenosis with neurogenic claudication    Asthma-COPD overlap syndrome (HCC/RAF)     Outpatient Medications Prior to Visit   Medication Sig    albuterol 90 mcg/act inhaler INHALE 2 PUFFS EVERY FOUR HOURS AS NEEDED FOR WHEEZING OR SHORTNESS OF BREATH.    ALLOPURINOL 300 mg tablet take 1 tablet by mouth every day    aspirin 81 mg EC tablet Take 1 tablet (81 mg total) by mouth daily.    budesonide-formoterol (SYMBICORT) 160-4.5 mcg/act inhaler INHALE 2 PUFFS TWO TIMES DAILY.    cyanocobalamin 1000 mcg tablet Take 1 tablet (1,000 mcg total) by mouth daily.    fish oil 1000 mg capsule Take 1 capsule (1 g total) by mouth daily.    losartan-hydroCHLOROthiazide 50-12.5 mg tablet Take 1 tablet by mouth daily.    Multiple Vitamin (MULTIVITAMIN ADULT PO) Take by mouth.    SIMVASTATIN 20 mg tablet take 1 tablet by mouth everyday at bedtime    tiotropium bromide monohydrate (SPIRIVA RESPIMAT) 2.5 mcg/act inhaler Inhale 2 puffs daily.    montelukast 10 mg tablet Take 1 tablet (10 mg total) by mouth every evening.    predniSONE 10 mg tablet 3 tabs once daily 3 days 2 tabs once daily 3 days. (Patient not taking: Reported on 11/13/2022.)     No facility-administered medications prior to visit.     No Known Allergies  ?  Review of systems:   ?Constitutional: Negative  Skin otherwise Negative  Conjunctiva and Lids: Negative    Objective:   General appearance: well-developed/well nourished for stated age, no apparent distress.  Mental status: alert and oriented.   Mood: cooperative, pleasant.  Affect: normal    A complete examination of skin was performed. This includes examination of the skin of the face, conjunctiva and eyelids, ears, lips and gums, scalp, hair, neck, chest, breast, axillae, abdomen, back, left and right upper  and lower extremities, inguinal folds, hands and feet, toenails and fingernails. The positive findings are listed below.  The remainder of the exam was unremarkable for suspicious lesions.     Exam/Assessment/Plan:    Melanocytic nevi of the trunk - chronic, stable   - Exam: Scattered brown macules with reassuring dermatoscopic features  - Counseled on reassuring morphology today   - Counseled on photoprotection   - Counseled to return to care for any concerning changes to r/o skin cancer    Lentigines - chronic, stable  - Exam: Brown macules with moth-eaten borders  - Counseled on reassuring morphology today   - Counseled on photoprotection   - Counseled to return to care for any concerning changes    Seborrheic keratoses - chronic, stable  - Exam: Brown, stuck-on papules  - Counseled on benign etiology without need for tx given asx    Cherry angiomas - chronic, stable  - Exam: Red macules and papules   - Counseled on benign etiology without need for tx    History of skin cancer  - Scar healing well without evidence of recurrence  - Counseled on photoprotection  - Continue regular FBSEs    Eczematous dermatitis of the R arm- chronic, uncontrolled  - Exam: pink scaly papules of the R arm   - Possible irritant contact dermatitis from watch   - Start TAC 0.1% cream BID  - Counseled on gentle skin care  - Counseled to inform providers of any concerning changes    Sun protection strategies and skin cancer risk factors reviewed.   ?  Follow-up in 12 months or earlier PRN    Margarito Courser, MD  Dermatology

## 2023-02-02 ENCOUNTER — Ambulatory Visit: Payer: PRIVATE HEALTH INSURANCE

## 2023-02-13 ENCOUNTER — Ambulatory Visit: Payer: BLUE CROSS/BLUE SHIELD | Attending: Student in an Organized Health Care Education/Training Program

## 2023-02-17 MED ORDER — SYMBICORT 160-4.5 MCG/ACT IN AERO
3 refills
Start: 2023-02-17 — End: ?

## 2023-02-19 ENCOUNTER — Ambulatory Visit: Payer: PRIVATE HEALTH INSURANCE

## 2023-02-19 MED ORDER — SYMBICORT 160-4.5 MCG/ACT IN AERO
3 refills | Status: AC
Start: 2023-02-19 — End: ?

## 2023-02-22 MED ORDER — SPIRIVA RESPIMAT 2.5 MCG/ACT IN AERS
2 | Freq: Every day | RESPIRATORY_TRACT | 11 refills
Start: 2023-02-22 — End: ?

## 2023-02-25 MED ORDER — SPIRIVA RESPIMAT 2.5 MCG/ACT IN AERS
2 | Freq: Every day | RESPIRATORY_TRACT | 3 refills
Start: 2023-02-25 — End: ?

## 2023-02-27 MED ORDER — SPIRIVA RESPIMAT 2.5 MCG/ACT IN AERS
2 | Freq: Every day | RESPIRATORY_TRACT | 3 refills | Status: AC
Start: 2023-02-27 — End: ?

## 2023-03-21 MED ORDER — ALLOPURINOL 300 MG PO TABS
300 mg | ORAL_TABLET | Freq: Every day | ORAL | 1 refills
Start: 2023-03-21 — End: ?

## 2023-03-21 MED ORDER — SIMVASTATIN 20 MG PO TABS
20 mg | ORAL_TABLET | Freq: Every evening | ORAL | 1 refills
Start: 2023-03-21 — End: ?

## 2023-03-22 ENCOUNTER — Ambulatory Visit: Payer: Medicare (Managed Care)

## 2023-03-22 DIAGNOSIS — J4540 Moderate persistent asthma, uncomplicated: Secondary | ICD-10-CM

## 2023-03-22 MED ORDER — BUDESONIDE-FORMOTEROL FUMARATE 160-4.5 MCG/ACT IN AERO
2 | Freq: Two times a day (BID) | RESPIRATORY_TRACT | 3 refills | Status: AC
Start: 2023-03-22 — End: ?

## 2023-03-22 MED ORDER — SIMVASTATIN 20 MG PO TABS
20 mg | ORAL_TABLET | Freq: Every evening | ORAL | 1 refills | Status: AC
Start: 2023-03-22 — End: 2023-03-26

## 2023-03-22 MED ORDER — ALLOPURINOL 300 MG PO TABS
300 mg | ORAL_TABLET | Freq: Every day | ORAL | 1 refills | Status: AC
Start: 2023-03-22 — End: 2023-03-26

## 2023-03-25 ENCOUNTER — Ambulatory Visit: Payer: Medicare (Managed Care)

## 2023-03-25 DIAGNOSIS — E785 Hyperlipidemia, unspecified: Secondary | ICD-10-CM

## 2023-03-25 DIAGNOSIS — I10 Essential (primary) hypertension: Secondary | ICD-10-CM

## 2023-03-25 DIAGNOSIS — J4520 Mild intermittent asthma, uncomplicated: Secondary | ICD-10-CM

## 2023-03-25 DIAGNOSIS — M10031 Idiopathic gout, right wrist: Secondary | ICD-10-CM

## 2023-03-26 MED ORDER — ALLOPURINOL 300 MG PO TABS
300 mg | ORAL_TABLET | Freq: Every day | ORAL | 1 refills | Status: AC
Start: 2023-03-26 — End: ?

## 2023-03-26 MED ORDER — ALBUTEROL SULFATE HFA 108 (90 BASE) MCG/ACT IN AERS
2 | RESPIRATORY_TRACT | 2 refills | Status: AC | PRN
Start: 2023-03-26 — End: ?

## 2023-03-26 MED ORDER — LOSARTAN POTASSIUM-HCTZ 50-12.5 MG PO TABS
1.0 | ORAL_TABLET | Freq: Every day | ORAL | 1 refills | Status: AC
Start: 2023-03-26 — End: ?

## 2023-03-26 MED ORDER — SIMVASTATIN 20 MG PO TABS
20 mg | ORAL_TABLET | Freq: Every evening | ORAL | 1 refills | Status: AC
Start: 2023-03-26 — End: ?

## 2023-04-11 ENCOUNTER — Ambulatory Visit: Payer: Medicare (Managed Care)

## 2023-04-16 ENCOUNTER — Other Ambulatory Visit: Payer: Medicare (Managed Care)

## 2023-04-16 DIAGNOSIS — J4540 Moderate persistent asthma, uncomplicated: Secondary | ICD-10-CM

## 2023-04-16 MED ORDER — BUDESONIDE-FORMOTEROL FUMARATE 160-4.5 MCG/ACT IN AERO
2 | Freq: Two times a day (BID) | RESPIRATORY_TRACT | 1 refills | Status: AC
Start: 2023-04-16 — End: ?

## 2023-04-16 MED ORDER — BUDESONIDE-FORMOTEROL FUMARATE 160-4.5 MCG/ACT IN AERO
2 | Freq: Two times a day (BID) | RESPIRATORY_TRACT | 1 refills | Status: AC
Start: 2023-04-16 — End: 2023-04-17

## 2023-04-16 MED ORDER — BUDESONIDE-FORMOTEROL FUMARATE 160-4.5 MCG/ACT IN AERO
2 | Freq: Two times a day (BID) | RESPIRATORY_TRACT | 3 refills | Status: AC
Start: 2023-04-16 — End: 2023-04-17

## 2023-04-16 NOTE — Addendum Note
Addended by: [name] on: 04/16/2023 02:01 PM     Modules accepted: Orders

## 2023-04-16 NOTE — Addendum Note
Addended by: [name] on: 04/16/2023 02:02 PM     Modules accepted: Orders

## 2023-04-24 MED ORDER — ALBUTEROL SULFATE HFA 108 (90 BASE) MCG/ACT IN AERS
2 | RESPIRATORY_TRACT | 2 refills | Status: AC | PRN
Start: 2023-04-24 — End: ?

## 2023-06-24 MED ORDER — LOSARTAN POTASSIUM-HCTZ 50-12.5 MG PO TABS
1 | ORAL_TABLET | Freq: Every day | ORAL | 2 refills | Status: AC
Start: 2023-06-24 — End: ?

## 2023-06-24 MED ORDER — ALLOPURINOL 300 MG PO TABS
300 mg | ORAL_TABLET | Freq: Every day | ORAL | 2 refills | Status: AC
Start: 2023-06-24 — End: ?

## 2023-06-24 MED ORDER — SIMVASTATIN 20 MG PO TABS
20 mg | ORAL_TABLET | Freq: Every evening | ORAL | 2 refills | Status: AC
Start: 2023-06-24 — End: ?

## 2023-07-17 MED ORDER — BUDESONIDE-FORMOTEROL FUMARATE 160-4.5 MCG/ACT IN AERO
0 refills | Status: AC
Start: 2023-07-17 — End: ?

## 2023-07-18 MED ORDER — SPIRIVA RESPIMAT 2.5 MCG/ACT IN AERS
2 | Freq: Every day | RESPIRATORY_TRACT | 7 refills | Status: AC
Start: 2023-07-18 — End: ?

## 2023-07-22 ENCOUNTER — Other Ambulatory Visit: Payer: BLUE CROSS/BLUE SHIELD

## 2023-07-23 NOTE — Telephone Encounter
 Patient has been scheduled accordingly

## 2023-07-24 ENCOUNTER — Ambulatory Visit: Payer: BLUE CROSS/BLUE SHIELD

## 2023-07-24 DIAGNOSIS — M5417 Radiculopathy, lumbosacral region: Secondary | ICD-10-CM

## 2023-07-24 NOTE — Progress Notes
 Patient Consent to Telehealth   The patient agreed to participate in the video visit prior to joining the visit.      [facility]  Department of Anesthesiology & Pain Medicine        Consultation Note:  Date of Service: 07/24/2023  Primary Care Physician: Lindajean Res, MD, MS    Referring Physician: Hillary Lowing, MD    Chief Complaint:   No chief complaint on file.    Interim History:  68 y.o. male with a past medical history significant for HTN presenting for continued management of buttock and leg pain. The patient reports his pain is returning. It is located in his lumbar low back with radiation into his thighs and gluts. The patient denies any new numbness, tingling, or weakness. The patient denies any loss of bowel or bladder function. No fevers, chills, night sweats, sores or sores. He is interested a repeat epidural injection.    VAS 6    Current treatment:   Ibuprofen  Tylenol  Heat    Physical therapy: completed 6+ sessions in last 3 months, completes HEP      Injection History:  04/12/22: L4-L5 IL ESI 70% relief        CURES Report:  Last reviewed on 07/24/2023 and was appropriate.      PAST MEDICAL HISTORY:  Past Medical History:   Diagnosis Date    Asthma Recur this year    Childhood then stopped. Recurred this year    Asthma-COPD overlap syndrome (HCC/RAF) 10/10/2022    Chicken pox     Cyst of kidney, acquired     Epilepsy (HCC/RAF)     Gout of wrist 08/04/2014    Last Assessment & Plan:  History of. No recent flare up per patient. Counseled on diet modification. Increase water intake. Check uric acid level. Last Assessment & Plan:  Has been doing well on allopurinol without improvement    Hx of colonic polyp     Hyperlipidemia     Hypertension     IBS (irritable bowel syndrome)     diarrhea    Macrocytic anemia 07/15/2020    Obstructive sleep apnea 12/14/2019    no CPAP    Peptic ulcer disease     Prediabetes     Seasonal allergies     Skin cancer        PAST SURGICAL HISTORY:   Past Surgical History:   Procedure Laterality Date    COLONOSCOPY  2020    Normal colo at [facility]. Needs to bring in pathology report. Had polyps    EYE SURGERY Bilateral     Lasik    MOHS SURGERY      TONSILLECTOMY         CURRENT MEDICATIONS:  Current Outpatient Medications   Medication Sig    ALBUTEROL 90 mcg/act inhaler INHALE 2 PUFFS EVERY FOUR (4) HOURS AS NEEDED FOR WHEEZING OR SHORTNESS OF BREATH.    ALLOPURINOL 300 mg tablet TAKE 1 TABLET (300 MG TOTAL) BY MOUTH DAILY.    aspirin 81 mg EC tablet Take 1 tablet (81 mg total) by mouth daily.    budesonide-formoterol 160-4.5 mcg/act inhaler INHALE 2 PUFFS BY MOUTH TWO (2) TIMES DAILY.    cyanocobalamin 1000 mcg tablet Take 1 tablet (1,000 mcg total) by mouth daily.    fish oil 1000 mg capsule Take 1 capsule (1 g total) by mouth daily.    LOSARTAN-HYDROCHLOROTHIAZIDE 50-12.5 mg tablet TAKE 1 TABLET BY MOUTH DAILY.  Multiple Vitamin (MULTIVITAMIN ADULT PO) Take by mouth.    predniSONE 10 mg tablet 3 tabs once daily 3 days 2 tabs once daily 3 days. (Patient not taking: Reported on 11/13/2022.)    SIMVASTATIN 20 mg tablet TAKE 1 TABLET (20 MG TOTAL) BY MOUTH AT BEDTIME.    tiotropium bromide monohydrate (SPIRIVA RESPIMAT) 2.5 mcg/act inhaler Inhale 2 puffs daily.    triamcinolone 0.1% cream Apply topically two (2) times daily. For rash of the arm    [DISCONTINUED] tiotropium bromide monohydrate (SPIRIVA RESPIMAT) 2.5 mcg/act inhaler Inhale 2 puffs daily.     No current facility-administered medications for this visit.         Physical Examination:   Vitals:   There were no vitals filed for this visit.         General: AAOx3, NAD and appropriately groomed                  Imaging Studies:     02/27/22 Lumbar MRI:           VERTEBRAE: Mild straightening of the normal lumbar lordosis. Normal vertebral body heights, alignment, and marrow signal of the lumbar spine. T2 signal hypointensity involving the left iliac bone, adjacent to the left sacroiliac joint (5-44), may   represent sclerosis.     DISCS: Mild lower lumbar spine disc height loss and desiccation, predominantly at L3-L4, L4-L5, and L5-S1.     FACET JOINTS: Mild to moderate facet hypertrophy in the lower lumbar spine, as described below.     SPINAL CANAL: Congenital narrowing of the spinal canal. Normal contour and position of the conus medullaris. No abnormal epidural fluid collection.     PARASPINAL SOFT TISSUES: Bilateral renal cysts, the largest measuring up to 5.0 cm in AP dimension in the right lower pole (5-22).     Findings by level:  T12-L1: No significant focal disc protrusion, spinal canal stenosis, or neural foraminal stenosis.  L1-L2: No significant focal disc protrusion, spinal canal stenosis, or neural foraminal stenosis.  L2-L3: Minimal circumferential disc bulge. No spinal canal stenosis or neural foraminal stenosis.  L3-L4: Circumferential disc bulge, mild ligamentum flavum thickening, and moderate facet hypertrophy causes minimal stenosis of the spinal canal. No neural foraminal stenosis.   L4-L5: Circumferential disc bulge, ligamentum flavum thickening, and moderate bilateral facet hypertrophy cause moderate spinal canal and bilateral neural foraminal stenosis.  L5-S1: Circumferential disc bulge. No spinal canal stenosis or neural foraminal stenosis.       Assessment:    Lumbar neurogenic claudication  L4-5 moderate stenosis     Impression:    68 y.o. male with a past medical history significant for HTN presenting for continued management of buttock and leg pain consistent with lumbar neurogenic claudication. Given the patient's benefit with his last epidural injection, I recommended a repeat right L4-5 interlaminar epidural steroid injection.           Treatment Recommendations:   1) Medical Modalities:  -No changes  2) Interventional Modalities:   -Repeat right L4-5 interlaminar epidural steroid injection   3) Behavioral Medicine Modalities: None  4) Imaging: none  5) Other Modalities: Continue HEP    A total of 30 minutes of time was spent on this encounter including reviewing prior records, imaging and laboratory results, and explanation of diagnosis, prognosis, risks and benefits of treatments, EHR documentation, importance of instruction and compliance, education, counseling and coordination of all future care and a detailed question and answer session.     Aurther Blue  Kennedy Peabody, MD  Assistant Clinical Professor  Interventional Spine & Pain Medicine   Department of Anesthesiology  [facility] [place] [facility] [place]

## 2023-08-05 ENCOUNTER — Telehealth: Payer: BLUE CROSS/BLUE SHIELD

## 2023-08-05 NOTE — Telephone Encounter
 Call Back Request      Reason for call back: Patient would like to schd a procedure.     Any Symptoms:  []  Yes  [x]  No      If yes, what symptoms are you experiencing:    Duration of symptoms (how long):    Have you taken medication for symptoms (OTC or Rx):      If call was taken outside of clinic hours:    [] Patient or caller has been notified that this message was sent outside of normal clinic hours.     [] Patient or caller has been warm transferred to the physician's answering service. If applicable, patient or caller informed to please call us  back if symptoms progress.  Patient or caller has been notified of the turnaround time of 1-2 business day(s).

## 2023-08-07 ENCOUNTER — Telehealth: Payer: BLUE CROSS/BLUE SHIELD

## 2023-08-07 NOTE — Telephone Encounter
 I reached out to the patient to get them scheduled for their procedure at [facility] in [place], left voicemail     Please transfer call to me if I am available  ex [phone]

## 2023-08-07 NOTE — Telephone Encounter
 Call Back Request      Reason for call back: Patient is returning Araceli's call. Thank you.        Any Symptoms:  []  Yes  [x]  No      If yes, what symptoms are you experiencing:    Duration of symptoms (how long):    Have you taken medication for symptoms (OTC or Rx):      If call was taken outside of clinic hours:    [] Patient or caller has been notified that this message was sent outside of normal clinic hours.     [] Patient or caller has been warm transferred to the physician's answering service. If applicable, patient or caller informed to please call us  back if symptoms progress.  Patient or caller has been notified of the turnaround time of 1-2 business day(s).

## 2023-08-14 MED ORDER — ALBUTEROL SULFATE HFA 108 (90 BASE) MCG/ACT IN AERS
2 | RESPIRATORY_TRACT | 3 refills | 25.00000 days | Status: AC | PRN
Start: 2023-08-14 — End: ?

## 2023-08-14 NOTE — Telephone Encounter
 Patient has been scheduled accordingly    Advised Appt info will show once ASC team approves request for appointment    DOS: 09/26/23 @ 8:20 AM

## 2023-09-05 NOTE — Telephone Encounter
 Patient has been scheduled for their procedure at:    [facility]  Address: [address]  [place], [place] [address]  Phone: [phone]    Date: 09/20/23  Check-in Time: The ASC team will provide you with check-in details when they call  Procedure Time: 10:40 AM    Patient was advised that the Surgery Center will contact them closer to the procedure date to confirm appointment and provide any instructions.    This information was provided over the phone to the patient and patient was advised this appointment wont be viewable on MyChart until the Surgery Team approves case request.

## 2023-09-11 ENCOUNTER — Ambulatory Visit: Payer: Medicare (Managed Care)

## 2023-09-11 DIAGNOSIS — M5417 Radiculopathy, lumbosacral region: Secondary | ICD-10-CM

## 2023-09-14 ENCOUNTER — Other Ambulatory Visit: Payer: Medicare (Managed Care)

## 2023-09-18 ENCOUNTER — Other Ambulatory Visit: Payer: Medicare (Managed Care)

## 2023-09-19 NOTE — Telephone Encounter
 Patient was contacted by the surgery center

## 2023-09-20 ENCOUNTER — Inpatient Hospital Stay
Admit: 2023-09-20 | Discharge: 2023-09-20 | Disposition: A | Discharge status: Home / Self Care | Payer: PRIVATE HEALTH INSURANCE

## 2023-09-20 DIAGNOSIS — R29818 Other symptoms and signs involving the nervous system: Principal | ICD-10-CM

## 2023-09-20 MED ADMIN — IOHEXOL 300 MG/ML IJ SOLN: @ 18:00:00 | Stop: 2023-09-20 | NDC 00407141310

## 2023-09-20 MED ADMIN — TRIAMCINOLONE ACETONIDE 40 MG/ML IJ SUSP: @ 18:00:00 | Stop: 2023-09-20 | NDC 00703024301

## 2023-09-20 MED ADMIN — LIDOCAINE HCL (PF) 1 % IJ SOLN: @ 18:00:00 | Stop: 2023-09-20 | NDC 00143959525

## 2023-09-20 NOTE — H&P
 UPDATED H&P REQUIREMENT    WHAT IS THE STATUS OF THE PATIENT'S MOST CURRENT HISTORY AND PHYSICAL?   - The most current H&P was performed within the past 24 hours. No additional updated H&P documentation is necessary.     REFER TO MEDICAL STAFF POLICIES REGARDING PRE-PROCEDURE HISTORY AND PHYSICAL EXAMINATION AND UPDATED H&P REQUIREMENTS BELOW:    Karle O[facility] and [facility] and [facility] Medical Staff Policy 200 - For Patients Undergoing Procedures Requiring Moderate or Deep Sedation, General Anesthesia or Regional Anesthesia    Contents of a History and Physical Examination (H&P):    The H&P shall consist of chief complaint, history of present illness, allergies and medications, relevant social and family history, past medical history, review of systems and physical examination, and assessment and plan appropriate to the patient's age.    For Patients Undergoing Procedures Requiring Moderate or Deep Sedation, General Anesthesia or Regional Anesthesia:    1. An H&P shall be performed within 24 hours prior to the procedure by a qualified member of the medical staff or designee with appropriate privileges, except as noted in item 2 below.    2. If a complete history and physical was performed within thirty (30) calendar days prior to the patient?s admission to the Medical Center for elective surgery, a member of the medical staff assumes the responsibility for the accuracy of the clinical information and will need to document in the medical record within twenty-four (24) hours of admission and prior to surgery or major invasive procedure, that they either attest that the history and physical has been reviewed and accepted, or document an update of the original history and physical relevant to the patient's current clinical status.    3. Providing an H&P for patients undergoing surgery under local anesthesia is at the discretion of the Attending Physician.     4. When a procedure is performed by a dentist, podiatrist or other practitioner who is not privileged to perform an H&P, the anesthesiologist?s assessment immediately prior to the procedure will constitute the 24 hour re-assessment.The dentist, podiatrist or other practitioner who is not privileged to perform an H&P will document the history and physical relevant to the procedure.    5. If the H&P and the written informed consent for the surgery or procedure are not recorded in the patient's medical record prior to surgery, the operation shall not be performed unless the attending physician states in writing that such a delay could lead to an adverse event or irreversible damage to the patient.    6. The above requirements shall not preclude the rendering of emergency medical or surgical care to a patient in dire circumstances.

## 2023-09-20 NOTE — Op Note
 Lumbar Translaminar Epidural Steroid Injection       Date of Service :  09/20/2023  Pre-operative Diagnosis: Lumbar Stenosis with Neurogenic Claudication    Post-operative Diagnosis: Lumbar Stenosis with Neurogenic Claudication      Operation Title(s):  1. L4-5 translaminar epidural steroid injection      2. Intraoperative fluoroscopy    Surgeon:   Dr. Kyra Ram, MD    Anesthesia:   Local     Indications: The patient?s history and physical exam were reviewed. The risks, benefits and alternatives to the procedure were discussed, and all questions were answered to the patient?s satisfaction. The patient agreed to proceed, and written informed consent was obtained.    Procedure in Detail:  The patient was brought into the procedure room and placed in the prone position on the fluoroscopy table. Standard monitors were placed, and vital signs were observed throughout the procedure. The area of the lumbar spine was prepped with Chlor-Prep times three and draped in a sterile manner. The L4-L5 interspace was identified and marked under AP fluoroscopy. The skin and subcutaneous tissues in the area were anesthetized with 1% lidocaine . A 17-gauge Tuohy epidural needle was directed toward the interspace under fluoroscopic guidance until the ligamentum flavum was engaged. From this point, a loss of resistance technique with a glass syringe and saline was used to identify entrance of the needle into the epidural space. Once a good loss of resistance was obtained, negative aspiration was confirmed and 1 mL of contrast solution was injected. An appropriate epidurogram was noted.    Then, after negative aspiration, a solution consisting of 2-mL triamcinolone  (40mg /mL) and 1-mL preservative-free saline was easily injected. The needle was removed with a 1% lidocaine  flush. The patient?s back was cleaned and a bandage was placed over the site of needle insertion.    Disposition: The patient tolerated the procedure well, and there were no apparent complications. Vital signs remained stable throughout the procedure. The patient was taken to the recovery area where written discharge instructions for the procedure were given.       Kyra Ram, MD  Assistant Clinical Professor  Interventional Spine & Pain Medicine   Department of Anesthesiology  [place] [facility] [place] [facility] [place]

## 2023-09-20 NOTE — Discharge Instructions
 DISCHARGE INSTRUCTIONS  You have had LUMBAR TRANSLAMINAR EPIDURAL STEROID INJECTION      Do not shower for 24 hours.  Do not bathe in tubs for 3 days.  Resume your medications, unless directed by Dr. Kennedy Peabody.  Resume a regular diet.  Activity as tolerated but do not overdue it.       The following may occur:  Dizziness  Leg or arm weakness - you may need some assistance walking  Numbness and/or warmth in arms or legs  Pain at needle insertion site(s)  Bleeding       Contact your doctor if any of the following occur:  Fever of 101.5 or greater  Excessive bleeding     Shower the next day.  Please avoid going in a bath or pool today to avoid soaking the injection sites.      Resume all daily medications.    **Please note we use Chloroprep or Hibeclens to clean your skin prior to the procedure. If Chloroprep was used, there may be residual [place] solution left on your skin following the procedure. It is anti-bacterial, and will wash off in the shower.**      PHONE NUMBERS:   Weekdays 8am - 5pm: [phone]  For any questions after hours, call the page operator at [phone] and ask for the Pain Fellow on-call, pager [phone]

## 2023-09-20 NOTE — H&P
 SURGICAL HISTORY AND PHYSICAL  PATIENT:  Joseph Donovan  MRN:  [id number]  DOB:  1955/09/19  DATE OF SERVICE:  09/20/2023  TIME:  10:15 AM  DIAGNOSIS:  Lumbosacral radiculopathy [M54.17]  Procedure(s):  right Lumbar L4-L5 translaminar epidural steroid injection with fluoroscopy and Local        [x]   INFORMED CONSENT OBTAINED INCLUDING RISKS BENEFITS AND ALTERNATIVES    [x]   INFORMED CONSENT OBTAINED FOR SEDATION INCLUDING RISKS, BENEFITS, AND/OR ALTERNATIVES    []   INFORMED CONSENT FOR BLOOD TRANSFUIONS INCLUDING RISKS, BENEFITS, AND/OR ALTERANTIVES     CHIEF COMPLAINT:  pain    HISTORY OF PRESENT ILLNESS:  This is a 68 y.o. male with a history of pain.         PAST MEDICAL HISTORY:    Past Medical History:   Diagnosis Date    Asthma Recur this year    Childhood then stopped. Recurred this year    Asthma-COPD overlap syndrome (HCC/RAF) 10/10/2022    Chicken pox     Cyst of kidney, acquired     Epilepsy (HCC/RAF)     Gout of wrist 08/04/2014    Last Assessment & Plan:  History of. No recent flare up per patient. Counseled on diet modification. Increase water intake. Check uric acid level. Last Assessment & Plan:  Has been doing well on allopurinol  without improvement    Hx of colonic polyp     Hyperlipidemia     Hypertension     IBS (irritable bowel syndrome)     diarrhea    Macrocytic anemia 07/15/2020    Obstructive sleep apnea 12/14/2019    no CPAP    Peptic ulcer disease     Prediabetes     Seasonal allergies     Skin cancer        PAST SURGICAL HISTORY:    Past Surgical History:   Procedure Laterality Date    COLONOSCOPY  2020    Normal colo at [facility]. Needs to bring in pathology report. Had polyps    EYE SURGERY Bilateral     Lasik    MOHS SURGERY      TONSILLECTOMY         MEDS:    No current facility-administered medications for this encounter.       FAMILY HISTORY:  Denies pertinent family history    SOCIAL HISTORY:  Denies pertinent social history    HISTORY OF BLEEDING:  denies    ALLERGIES:  No Known Allergies       AIRWAY ASSESSMENT +:  []  Yes^  [x]  No   Unable to visualize uvula  []  Yes^  [x]  No   C-spine precautions or unable to flex/extend neck  []  Yes^  [x]  No   Sleep apnea confirmed by sleep study or on CPAP at home  ASA CLASSIFICATION #:  []  Class I     A normal, healthy patient  [x]  Class II    A patient with mild systemic disease  []  Class III   A patient with severe systemic disease that limits                       activity but is not incapacitating  []  Class IV   A patient with an incapacitating systemic disease that                      is a constant threat to life  []  Class V  A moribund patient who is agonal, not expected to                       live 24 hours, with or without operation; always                       considered an emergency (E)  Follow each class with an (E) to denote emergency operation.  * Required physical exam elements for H&P  # Required exam elements for all cases with sedation without an anesthesiologist present  ^ Denotes extra caution required      REVIEW OF SYSTEMS:     General ROS: negative for - chills, fatigue or fever  Psychological ROS: negative for - anxiety, behavioral disorder or concentration difficulties  Respiratory ROS: no cough, shortness of breath, or wheezing  Cardiovascular ROS: no chest pain or dyspnea on exertion  Gastrointestinal ROS: no abdominal pain, change in bowel habits, or black or bloody stools  Genito-Urinary ROS: no dysuria, trouble voiding, or hematuria  Musculoskeletal ROS: positive for - pain  Neurological ROS: no TIA or stroke symptoms     PHYSICAL EXAMINATION:  Last Recorded Vital Signs:    09/20/23 0936   BP: 114/71   Pulse: 71   Resp: 15   Temp: 36.6 ?C (97.9 ?F)   SpO2: 100%     GENERAL:  alert, well appearing, and in no distress  HEENT:  Normal, araumatic, normocephalic  HEART:  regular rate and rhythm, no murmurs  LUNGS:  clear to auscultation, no wheezes or rales, unlabored breathing  ABDOMEN:  soft, nontender, nondistended, no masses or organomegaly  EXTREMITIES:  warm and dry, no edema, normal strength, tone, and muscle mass  GENITALIA:  deferred  NEURO:  gross motor exam normal by observation, strength normal and symmetric     ASSESSMENT & PLAN:     1.  Joseph Donovan is a 68 y.o. male with a diagnosis of pain who presents for interventional pain procedure.  2.  Risks and benefits of the procedure were reviewed with the patient/family and all questions have been answered.  The patient/family has agreed to proceed with the scheduled procedure.  3.  The patient/family has signed the appropriate consent documents.

## 2023-10-09 ENCOUNTER — Ambulatory Visit: Payer: Medicare (Managed Care)

## 2023-10-09 DIAGNOSIS — Z0000 Encounter for general adult medical examination without abnormal findings: Principal | ICD-10-CM

## 2023-10-09 NOTE — Progress Notes
 [facility] - MEDICARE [place] VISIT    PATIENT:  Joseph Donovan   MRN:  [id number]  DOB:  [birth year]  DATE OF SERVICE:  10/11/2023      PRIMARY CARE PROVIDER: Laurence Malta, MD, MS  REFERRING PROVIDER: Laurence Malta, MD, MS    Chief Complaint   Patient presents with    Annual Exam       Subjective:   Joseph Donovan is a a 68 y.o. male here for an Annual Wellness Exam and a comprehensive review of chronic medical conditions.    Has Medicare, due for subsequent wellness visit.    #HTN  - BP usually in normal range  - denies lightheadedness, dizziness  - takes med at night    #Asthma  - stable    #Gout  - no flare-ups on allopurinol     #HLD  - on simvastatin   - stopped asa d/t stomach upset      Current exercise habits: regular; bikes, gyms  Dietary issues: Tries to eat well balanced meals    Hearing difficulties - no  Memory: no concerns  Safe in current home environment: yes, lives with wife    Current mobility: no significant limitations    Patient Care Team:  Laurence Malta, MD, MS as PCP - General (Family Medicine)  Pcp, Unassigned Generic Jacobo Handler as PCP - Plan Assigned    Medications, Allergies, PMH, PSH, FamHx, ROS, Immunizations:    Patient Active Problem List    Diagnosis Date Noted    Asthma-COPD overlap syndrome (HCC/RAF) 10/10/2022    HTN (hypertension) 09/17/2022    Tortuous aorta 09/17/2022    Atherosclerosis of aorta 09/17/2022    Lumbar stenosis with neurogenic claudication 09/17/2022    Epilepsy (HCC/RAF) 04/27/2021    Hx of colonic polyp     Seasonal allergies     Skin cancer     Reactive airway disease with wheezing 07/15/2020    Macrocytic anemia 07/15/2020    Alcohol  use 07/15/2020    Prediabetes 07/15/2020    Obstructive sleep apnea 12/14/2019     Formatting of this note might be different from the original.  Unable to tolerate mask      Cyst of kidney, acquired 12/22/2018    Low vitamin B12 level 10/31/2017     Last Assessment & Plan:   Repeat lab for monitoring.  Likely contributing to pt's tiredness.  Increase B Complex and B12 supplement empirically.      Macrocytosis without anemia 10/31/2017     Last Assessment & Plan:   MCV above 100.  History of normal MCV in the past  No anemia.  Hemodynamically stable.  Counseled on increase B complex supplement and B12 and folic acid empirically.  Repeat lab and monitor lab results.      Snores 10/31/2017     Last Assessment & Plan:   Concerned for obstructive apnea.  Pt wants to hold sleep study for now.  Counseled on weight loss by increase in exercise and decrease in carbohydrate and fatty food intake.  Bed elevation to 30 to 45 degree.  OTC oral mouth piece.      IBS (irritable bowel syndrome) 08/13/2017     Last Assessment & Plan:   Try METAMUCIL   Levsin prn  Call in a month, consider a CT      BMI 31.0-31.9,adult 04/19/2017     Last Assessment & Plan:   Counseled on weight loss by increase in exercise and decrease  in carbohydrate and fatty food intake.      Tremor of both hands 11/18/2015     Last Assessment & Plan:   Formatting of this note might be different from the original.  Minimal tremor.  No focal neurological symptoms or weakness.  Check vitamin B12 and folate, and TSH and T4 for evaluation.  Consider neurology referral evaluation if condition worsens.      Gout of wrist 08/04/2014     Last Assessment & Plan:   History of.  No recent flare up per patient.  Counseled on diet modification.  Increase water intake.  Check uric acid level.  Last Assessment & Plan:   Has been doing well on allopurinol  without improvement      Hyperlipidemia 06/28/2006     Last Assessment & Plan:   The ASCVD Risk score Verdon DC Jr., et al., 2013) failed to calculate for the following reasons:    The valid total cholesterol range is 130 to 320 mg/dL  Stable on statin medication.  Counseled on weight loss by increase in exercise and decrease in carbohydrate and fatty food intake.  Repeat lab for monitoring.  Last Assessment & Plan:   Reviewed lab from 2014, well controlled.  Rec stop niacin and red yeast rice for primary prevention.  Conitnue simvastatin .  WIll recheck lipids at physical in 3 months       Past Medical History:   Diagnosis Date    Asthma Recur this year    Childhood then stopped. Recurred this year    Asthma-COPD overlap syndrome (HCC/RAF) 10/10/2022    Chicken pox     Cyst of kidney, acquired     Epilepsy (HCC/RAF)     Gout of wrist 08/04/2014    Last Assessment & Plan:  History of. No recent flare up per patient. Counseled on diet modification. Increase water intake. Check uric acid level. Last Assessment & Plan:  Has been doing well on allopurinol  without improvement    Hx of colonic polyp     Hyperlipidemia     Hypertension     IBS (irritable bowel syndrome)     diarrhea    Macrocytic anemia 07/15/2020    Obstructive sleep apnea 12/14/2019    no CPAP    Peptic ulcer disease     Prediabetes     Seasonal allergies     Skin cancer      Past Surgical History:   Procedure Laterality Date    COLONOSCOPY  2020    Normal colo at [facility]. Needs to bring in pathology report. Had polyps    EYE SURGERY Bilateral     Lasik    MOHS SURGERY      TONSILLECTOMY       Patient has no known allergies.  Outpatient Medications Marked as Taking for the 10/11/23 encounter (Office Visit) with Laurence Malta, MD, MS   Medication    ALBUTEROL  90 mcg/act inhaler    ALLOPURINOL  300 mg tablet    budesonide -formoterol  160-4.5 mcg/act inhaler    cyanocobalamin 1000 mcg tablet    fish oil  1000 mg capsule    LOSARTAN -HYDROCHLOROTHIAZIDE  50-12.5 mg tablet    Multiple Vitamin (MULTIVITAMIN ADULT PO)    SIMVASTATIN  20 mg tablet    SPIKEVAX 50 MCG/0.5ML SUSY    tiotropium bromide monohydrate  (SPIRIVA  RESPIMAT) 2.5 mcg/act inhaler    triamcinolone  0.1% cream     Social History     Socioeconomic History    Marital status: Married   Tobacco Use  Smoking status: Former     Current packs/day: 0.00     Average packs/day: 0.5 packs/day for 30.0 years (15.0 ttl pk-yrs)     Types: Cigarettes     Start date: 03/26/1977     Quit date: 03/27/2007     Years since quitting: 16.5    Smokeless tobacco: Never   Vaping Use    Vaping status: Never Used   Substance and Sexual Activity    Alcohol  use: Yes     Alcohol /week: 2.4 oz     Types: 4 Glasses of Wine (5 oz) per week     Comment: socially    Drug use: Not Currently    Sexual activity: Yes     Partners: Female     Birth control/protection: Male Condom, Withdrawal / Coitus Interruptus   Other Topics Concern    Do you exercise at least a day, 3 or more days a week? Yes    Do you follow a special diet? No    Lactose Free? Yes    Types of Exercise? (List in Comments) Yes     Comment: Gym, hiking, cycling   Social History Narrative    Married with step daughter.         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     Will be retiring.      Social Drivers of Health     Physical Activity: Sufficiently Active (09/17/2022)    Physical Activity     Days of Exercise per Week: 7 days     Minutes of Exercise per Session: 40 min   Stress: Stress Concern Present (09/17/2022)    Stress     Feeling of Stress : To some extent   Financial Resource Strain: Low Risk  (09/17/2022)    Financial Resource Strain     Difficulty of Paying Living Expenses: Not hard at all     Occupational History    Not on file     Family History   Problem Relation Age of Onset    Depression Mother     Meniere's disease Mother     Brain cancer Father 66    Cancer Father         Brain tumor (fatal)       Immunization History   Administered Date(s) Administered    COVID-19 (Moderna 12y and up) PF, 50 mcg/0.5 mL 12/25/2021, 06/15/2022, 11/23/2022, 08/21/2023    COVID-19, mRNA, (Moderna) 100 mcg/0.5 mL 06/19/2019, 07/17/2019, 01/28/2020, 06/25/2020    COVID-19, mRNA, bivalent (Moderna) 50 mcg/0.5 mL (12y and up) 12/02/2020    Influenza vaccine IM quadrivalent (Afluria Quad) (PF) SYR (80 years of age and older) 02/07/2015, 01/27/2016, 12/13/2016    Td 10/05/2003    Tdap 04/12/2009, 10/15/2016    Typhoid Inactivated, ViCPs 10/05/2003    influenza vaccine IM cell culture quadrivalent (Flucelvax QUAD) (PF) SYR (80 months of age and older) 12/11/2019    influenza vaccine IM quadrivalent (Fluarix Quad) (PF) SYR (31 months of age and older) 03/11/2018, 12/26/2018    influenza vaccine IM quadrivalent high dose (Fluzone High Dose Quad) (PF) SYR (62 years of age and older) 12/11/2021    influenza, unspecified formulation 02/07/2015, 12/31/2015, 01/27/2016, 12/13/2016, 12/02/2020    zoster vac recomb adjuvanted (Shingrix) 03/29/2018, 05/31/2018  Health Maintenance   Topic Date Due    Advance Directive  Never done    AAA Screening  Never done    Preventive Wellness Visit  10/08/2023    Colorectal Cancer Screening  10/09/2023    Lung Cancer Screening  11/06/2023    Influenza Vaccine (1) 11/25/2023    Tdap/Td Vaccine (3 - Td or Tdap) 10/16/2026    Hepatitis B Screening  Completed    Pneumococcal Vaccine  Completed    Hepatitis C Screening  Completed    Shingles (Shingrix) Vaccine  Completed    COVID-19 Vaccine(Tracks primary and booster doses, not sup/immunocomp)  Completed    Statin prescribed for ASCVD Prevention or Treatment  Completed       Review of Systems:  Neg except as mentioned in HPI    Activities of Daily Living:    --ADLs: independent  --iADLs: independent      Fall Screen:        Fall in the past year?   [ ]   yes   [x]  no  Comments:     Fear of falling?    [ ]   yes   [x]  no   Comments:     Use of assistive device?   [ ]   yes   [x]  no  Comments:         Depression Screen:          12/26/2018     8:37 AM 12/26/2018     8:58 AM 06/06/2020     3:36 PM 09/21/2021     2:24 PM 09/17/2022    10:00 AM 10/08/2022    10:11 AM 10/11/2023     7:56 AM   Depression Screening (Patient Health Questionnaire PHQ)   PHQ-2: Feeling down, depressed, or hopeless No  No Not at all  Not at all Not at all   PHQ-2: Little interest or pleassure in doing things No  No Not at all  Not at all Not at all   Little interest or pleasure in doing things  Several days   Not at all     Feeling down, depressed, or hopeless  Not at all   Not at all     Trouble falling or staying asleep, or sleeping too much  Not at all   Several days     Feeling tired or having little energy  Several days   Not at all     Poor appetite or overeating  Not at all   Not at all     Feeling bad about yourself - or that you are a failure or have let yourself or your family down  Not at all   Not at all     Trouble concentrating on things, such as reading the newspaper or watching television  Not at all   Not at all     Moving or speaking so slowly Or being so fidgety or restless  Not at all   Not at all     Thoughts that you would be better off dead, or of hurting yourself in some way  Not at all   Not at all     Total Score  2   1         Advanced Care Planning:    This patient has an advanced directive:  Yes [ ]   No [x]  Unknown [ ]     This patient's designated decision maker if or when patient lacks capacity is: wife  Code Status Specifics (designated by patient): Full code        Objective:   PHYSICAL EXAM:  BP 99/58  ~ Pulse 60  ~ Temp 36.4 ?C (97.6 ?F) (Forehead)  ~ Resp 20  ~ Ht 6' 1'' (1.854 m)  ~ Wt 184 lb (83.5 kg)  ~ SpO2 99%  ~ BMI 24.28 kg/m?     Gen: alert, well developed, well nourished, NAD  HEENT:  NC/AT, PERRL/EOMI, MMM, OP clear  Neck: supple  CV: RRR; normal S1/S2; no murmurs, rubs, gallops  Lungs: CTAB; no wheezes, rales, rhonchi  Abd: soft, NT/ND, +BS  Extr: no C/C/E  Neuro: A&O x 4, non-focal exam    Gait: normal gait and station  Get-Up-And-Go Test - normal      LABS:  Lab Results   Component Value Date    WBC 5.63 10/10/2022    HGB 12.6 (L) 10/10/2022    HCT 37.9 (L) 10/10/2022    MCV 99.5 (H) 10/10/2022    PLT 295 10/10/2022     Lab Results   Component Value Date    CREAT 1.05 10/10/2022    BUN 22 10/10/2022    NA 142 10/10/2022    K 4.8 10/10/2022    CL 105 10/10/2022    CO2 26 10/10/2022     Lab Results   Component Value Date    ALT 20 10/10/2022    AST 25 10/10/2022    ALKPHOS 63 10/10/2022    BILITOT 0.9 10/10/2022     Lab Results   Component Value Date    CHOL 143 10/10/2022    CHOLDLCAL 78 10/10/2022    CHOLHDL 52 10/10/2022    TRIGLY 67 10/10/2022     Lab Results   Component Value Date    TSH 1.8 10/10/2022     Lab Results   Component Value Date    HGBA1C 5.8 (H) 10/10/2022       Assessment & Plan:   Diagnoses and all orders for this visit:    Encounter for annual wellness visit (AWV) in Medicare patient  -     CBC & Auto Differential; Future  -     Comprehensive Metabolic Panel; Future  -     Hgb A1c; Future  -     Lipid Panel; Future  -     TSH with reflex FT4, FT3; Future  -     PSA,Screening; Future  -     Uric Acid; Future    Primary hypertension  Typically normotensive though low-normal today. Denies symptoms. Consider in setting of not having eaten yet this AM.  - continue losartan -hydrochlorothiazide  50-12.5 mg daily  - advised to check home BP regularly and f/u if persistently low  -     Comprehensive Metabolic Panel; Future    Hyperlipidemia, unspecified hyperlipidemia type  Atherosclerosis of aorta  ACC/AHA Cholesterol Management Guideline Recommendations:   High-intensity statin because of ASCVD diagnosis or LDL>=190mg /dL. Patient is only currently receiving a moderate-intensity statin.    ASCVD Risk Score    10-year ASCVD risk  N/A. Patient has ASCVD or LDL>=190 as of 8:21 AM on 92/81/7974.   10-year ASCVD risk with optimal risk factors  is 9.6%.   Values used to calculate ASCVD Risk Score    Age  41 y.o.     Gender  male   Race  European   HDL Cholesterol  52 mg/dL. (measured on 10/10/2022)   LDL Cholesterol  78 mg/dL. (measured on 10/10/2022)  Total Cholesterol  143 mg/dL. (measured on 10/10/2022)   Systolic Blood Pressure  99 mm Hg. (measured on 10/11/2023)   Blood Pressure Medication Present  Yes   Smoking Status  currently not a smoker   Diabetes Present  No   Click here for the [facility] ASCVD Cardiovascular Risk Estimator Plus tool Office manager).  - continue simvastatin  20 mg daily  - had trialed ASA but developed GI side effects        -     Lipid Panel; Future    Screening for colon cancer  -     Referral for Colonoscopy Procedure    History of smoking  -     US  abd aorta screening non-vascular; Future    Idiopathic gout, unspecified chronicity, unspecified site  Well controlled.  - continue allopurinol  300 mg daily  -     Uric Acid; Future    Screening for prostate cancer  -     PSA,Screening; Future    Moderate persistent asthma, unspecified whether complicated  Stable on current regimen.  - f/u pulmonology     Kidney cysts  Incidental finding on prior imaging.  - CTM for now; declines referral at this time     Lumbar radiculopathy  F/b Dr. Sherral, pain management.    Diet Assessed [x]   [] Calories [] Fat [] Sugar/Sweets []  Sugar Sweetened Beverages/Juice [] Calcium [] Salt [] Protein [] Fruit/Vegetables  [] Other    Exercise Assessed [x]     Lifestyle Issues Assessed [x]   [] Stress [] Safety [] Seatbelts [] Violence    Immunization Status Determined [x]   [] Td [] Tdap [] Flu [] Hep A [] Hep B [] HPV [] Menactra [] Pneumovax [] Prevnar  []  Shingrix #1 []  Shingrix #2 (2-6 mo later) [] Other    Secondary Prevention Needed  [] Pap [] Mammogram [] Colonoscopy [] FIT     Labs: as ordered above  [] CBC [] Chem Panel [] TSH[] LIPIDS [] FBS [] HbA1c [] RUA [] Vitamin D [] PSA [] HCV Ab  STD Screening: [] HIV [] RPR [] HBV S Ag [] Gonorrhea [] Chlamydia    Osteoporosis Prevention  [x] Calcium 1200mg  in diet or vitamins  [x] Vit D 400-800u [x] Wt lifting 20 min 3x/week  [x] Weight Bearing 30 min most days     Osteoporosis Evaluation  [] Dexa (LOCATION) [] Ca++ [] Alk phos [] Phos [] Vit D [] PTH [] N-Telopeptide,urine    Carotid Stenosis F/up (Carotid Duplex) []     Advanced Care Planning  [x]  Advanced Health Care Directive, given to patient, form reviewed w pt, review completed form next visit    11. Current or Former Smoker:    []  Smoking Cessation Counseling    []  AAA screening (men, 65-75yo, 100+ cigs lifetime or 1st degree relative AAA)    []  Low-dose Chest CT qyr (30+ pack-year history, current or quit <=15 years        age 63-75yo or to 67 yo if in good health)    The above plan of care, diagnosis, order, and follow-up were discussed with the patient. Questions related to this recommended plan of care were answered.  The additional diagnoses assessed at this visit constituted a separate, identifiable service from the routine preventive health examination.    Patient Instructions   If I have ordered labs for you, please make sure to confirm with your insurance that they are covered as plan coverage can vary.    Please be aware that I do not typically check messages after hours and on weekends as this portal is for non-urgent messages.  If you have an urgent concern, please call the clinic for assistance.     For your abdominal ultrasound imaging test, you may schedule  at:    [facility]  [phone] Main Scheduling  [phone] Direct  [phone] [facility] Direct    [facility]  [phone]    [facility]  [phone]  --------------  To schedule your colonoscopy, please call:    [facility]   Drs. Didi Mwengela, Nimah Ather, Sonya Dasharathy, Adrienne Lenhart, Ellie Rhyleigh Grassel  [address]  [place], [place] [address]  [phone]    --------------------------------------------------------------------   For labs, you can walk into [facility] on the 1st floor of our building (no appointment needed) -      [facility]              [address]              [place], [place] [address]    Hours: Monday - Friday, 8 am - 5 pm    You can text [phone] with ''[email or web address]'' ahead to get in line virtually to minimize waiting in line in person.  You will then receive text message to confirm your place in line (with estimate on wait time).  You also can download the ''QLess'' app, and it will go through similar process to get you in line virtually.  Please note that lines may be longer in the mornings.       Gari Door, MD, MS

## 2023-10-09 NOTE — Progress Notes
 Patient Consent to Telehealth   The patient agreed to participate in the video visit prior to joining the visit.      [facility]  Department of Anesthesiology & Pain Medicine        Consultation Note:  Date of Service: 10/09/2023  Primary Care Physician: Laurence Malta, MD, MS    Referring Physician: Starleen Ingle, MD    Chief Complaint:   No chief complaint on file.    Interim History:  68 y.o. male with a past medical history significant for HTN presenting for continued management of buttock and leg pain. The patient reports his pain is has significantly improved after his lumbar injection. Hist pain is now mild. The patient denies any new numbness, tingling, or weakness. The patient denies any loss of bowel or bladder function. No fevers, chills, night sweats, sores or sores.      Current treatment:   Ibuprofen  Tylenol  Heat    Physical therapy: completed 6+ sessions in last 3 months, completes HEP      Injection History:  09/20/23: L4-L5 IL ESI 70% relief   11/21/22: L4-L5 IL ESI 80% relief   04/12/22: L4-L5 IL ESI 70% relief        CURES Report:  Last reviewed on 10/09/2023 and was appropriate.      PAST MEDICAL HISTORY:  Past Medical History:   Diagnosis Date    Asthma Recur this year    Childhood then stopped. Recurred this year    Asthma-COPD overlap syndrome (HCC/RAF) 10/10/2022    Chicken pox     Cyst of kidney, acquired     Epilepsy (HCC/RAF)     Gout of wrist 08/04/2014    Last Assessment & Plan:  History of. No recent flare up per patient. Counseled on diet modification. Increase water intake. Check uric acid level. Last Assessment & Plan:  Has been doing well on allopurinol  without improvement    Hx of colonic polyp     Hyperlipidemia     Hypertension     IBS (irritable bowel syndrome)     diarrhea    Macrocytic anemia 07/15/2020    Obstructive sleep apnea 12/14/2019    no CPAP    Peptic ulcer disease     Prediabetes     Seasonal allergies     Skin cancer PAST SURGICAL HISTORY:   Past Surgical History:   Procedure Laterality Date    COLONOSCOPY  2020    Normal colo at [facility]. Needs to bring in pathology report. Had polyps    EYE SURGERY Bilateral     Lasik    MOHS SURGERY      TONSILLECTOMY         CURRENT MEDICATIONS:  Current Outpatient Medications   Medication Sig    ALBUTEROL  90 mcg/act inhaler INHALE 2 PUFFS EVERY FOUR (4) HOURS AS NEEDED FOR WHEEZING OR SHORTNESS OF BREATH.    ALLOPURINOL  300 mg tablet TAKE 1 TABLET (300 MG TOTAL) BY MOUTH DAILY.    aspirin 81 mg EC tablet Take 1 tablet (81 mg total) by mouth daily.    budesonide -formoterol  160-4.5 mcg/act inhaler INHALE 2 PUFFS BY MOUTH TWO (2) TIMES DAILY.    cyanocobalamin 1000 mcg tablet Take 1 tablet (1,000 mcg total) by mouth daily.    fish oil  1000 mg capsule Take 1 capsule (1 g total) by mouth daily.    LOSARTAN -HYDROCHLOROTHIAZIDE  50-12.5 mg tablet TAKE 1 TABLET BY MOUTH DAILY.    Multiple Vitamin (  MULTIVITAMIN ADULT PO) Take by mouth.    predniSONE  10 mg tablet 3 tabs once daily 3 days 2 tabs once daily 3 days. (Patient not taking: Reported on 11/13/2022.)    SIMVASTATIN  20 mg tablet TAKE 1 TABLET (20 MG TOTAL) BY MOUTH AT BEDTIME.    tiotropium bromide monohydrate  (SPIRIVA  RESPIMAT) 2.5 mcg/act inhaler Inhale 2 puffs daily.    triamcinolone  0.1% cream Apply topically two (2) times daily. For rash of the arm     No current facility-administered medications for this visit.         Physical Examination:   Vitals:   There were no vitals filed for this visit.         General: AAOx3, NAD and appropriately groomed                  Imaging Studies:     02/27/22 Lumbar MRI:           VERTEBRAE: Mild straightening of the normal lumbar lordosis. Normal vertebral body heights, alignment, and marrow signal of the lumbar spine. T2 signal hypointensity involving the left iliac bone, adjacent to the left sacroiliac joint (5-44), may   represent sclerosis.     DISCS: Mild lower lumbar spine disc height loss and desiccation, predominantly at L3-L4, L4-L5, and L5-S1.     FACET JOINTS: Mild to moderate facet hypertrophy in the lower lumbar spine, as described below.     SPINAL CANAL: Congenital narrowing of the spinal canal. Normal contour and position of the conus medullaris. No abnormal epidural fluid collection.     PARASPINAL SOFT TISSUES: Bilateral renal cysts, the largest measuring up to 5.0 cm in AP dimension in the right lower pole (5-22).     Findings by level:  T12-L1: No significant focal disc protrusion, spinal canal stenosis, or neural foraminal stenosis.  L1-L2: No significant focal disc protrusion, spinal canal stenosis, or neural foraminal stenosis.  L2-L3: Minimal circumferential disc bulge. No spinal canal stenosis or neural foraminal stenosis.  L3-L4: Circumferential disc bulge, mild ligamentum flavum thickening, and moderate facet hypertrophy causes minimal stenosis of the spinal canal. No neural foraminal stenosis.   L4-L5: Circumferential disc bulge, ligamentum flavum thickening, and moderate bilateral facet hypertrophy cause moderate spinal canal and bilateral neural foraminal stenosis.  L5-S1: Circumferential disc bulge. No spinal canal stenosis or neural foraminal stenosis.       Assessment:    Lumbar neurogenic claudication  L4-5 moderate stenosis     Impression:    68 y.o. male with a past medical history significant for HTN presenting for continued management of buttock and leg pain consistent with lumbar neurogenic claudication. We discussed treatment options and I recommended the patient advance their physical activities. The patient will follow up should their pain worsen for possible repeat epidural injection.           Treatment Recommendations:   1) Medical Modalities:  -No changes  2) Interventional Modalities:   -Repeat right L4-5 interlaminar epidural steroid injection   3) Behavioral Medicine Modalities: None  4) Imaging: none  5) Other Modalities: Continue HEP    A total of 21 minutes of time was spent on this encounter including reviewing prior records, imaging and laboratory results, and explanation of diagnosis, prognosis, risks and benefits of treatments, EHR documentation, importance of instruction and compliance, education, counseling and coordination of all future care and a detailed question and answer session.     Kyra Ram, MD  Assistant Clinical Professor  Interventional Spine &  Pain Medicine   Department of Anesthesiology  [facility] [place]

## 2023-10-11 ENCOUNTER — Inpatient Hospital Stay: Payer: PRIVATE HEALTH INSURANCE

## 2023-10-11 ENCOUNTER — Ambulatory Visit: Payer: BLUE CROSS/BLUE SHIELD | Attending: Student in an Organized Health Care Education/Training Program

## 2023-10-11 ENCOUNTER — Other Ambulatory Visit: Payer: PRIVATE HEALTH INSURANCE

## 2023-10-11 ENCOUNTER — Telehealth: Payer: Medicare (Managed Care)

## 2023-10-11 DIAGNOSIS — M5416 Radiculopathy, lumbar region: Secondary | ICD-10-CM

## 2023-10-11 DIAGNOSIS — M1000 Idiopathic gout, unspecified site: Secondary | ICD-10-CM

## 2023-10-11 DIAGNOSIS — I10 Essential (primary) hypertension: Secondary | ICD-10-CM

## 2023-10-11 DIAGNOSIS — Z1211 Encounter for screening for malignant neoplasm of colon: Secondary | ICD-10-CM

## 2023-10-11 DIAGNOSIS — E785 Hyperlipidemia, unspecified: Secondary | ICD-10-CM

## 2023-10-11 DIAGNOSIS — Z125 Encounter for screening for malignant neoplasm of prostate: Secondary | ICD-10-CM

## 2023-10-11 DIAGNOSIS — Z87891 Personal history of nicotine dependence: Secondary | ICD-10-CM

## 2023-10-11 DIAGNOSIS — J4540 Moderate persistent asthma, uncomplicated: Secondary | ICD-10-CM

## 2023-10-11 NOTE — Telephone Encounter
 MPU Checklist    Procedure Scheduled:   Colonoscopy    Prep instructions have been delivered to patient via:   Mychart     Encounter sent to FCU team if the following applies:   N/A    Sedation Method:   MAC- MAC script was given     MD/PREP: (open access pts)   Joseph Donovan- Golytely

## 2023-10-11 NOTE — Patient Instructions
 If I have ordered labs for you, please make sure to confirm with your insurance that they are covered as plan coverage can vary.    Please be aware that I do not typically check messages after hours and on weekends as this portal is for non-urgent messages.  If you have an urgent concern, please call the clinic for assistance.     For your abdominal ultrasound imaging test, you may schedule at:    [facility]  [phone] Main Scheduling  [phone] Direct  [phone] [facility] Imaging Direct    [facility]  [phone]    [facility]  [phone]  --------------  To schedule your colonoscopy, please call:    [facility]   Drs. Didi Mwengela, Nimah Ather, Sonya Dasharathy, Adrienne Lenhart, Ellie Beyonce Sawatzky  [address]  [place], [place] [address]  [phone]    --------------------------------------------------------------------   For labs, you can walk into [facility] on the 1st floor of our building (no appointment needed) -      [facility]              [address]              [place], [place] [address]    Hours: Monday - Friday, 8 am - 5 pm    You can text [phone] with ''[email or web address]'' ahead to get in line virtually to minimize waiting in line in person.  You will then receive text message to confirm your place in line (with estimate on wait time).  You also can download the ''QLess'' app, and it will go through similar process to get you in line virtually.  Please note that lines may be longer in the mornings.

## 2023-10-15 ENCOUNTER — Other Ambulatory Visit: Payer: Medicare (Managed Care)

## 2023-10-15 DIAGNOSIS — I10 Essential (primary) hypertension: Principal | ICD-10-CM

## 2023-10-16 ENCOUNTER — Other Ambulatory Visit: Payer: Medicare (Managed Care)

## 2023-10-16 MED ORDER — LOSARTAN POTASSIUM 50 MG PO TABS
50 mg | ORAL_TABLET | Freq: Every day | ORAL | 1 refills | 90.00000 days | Status: AC
Start: 2023-10-16 — End: ?

## 2023-10-16 MED ORDER — PEG 3350-KCL-NABCB-NACL-NASULF 236 G PO SOLR
4 L | Freq: Once | ORAL | 0 refills | Status: AC
Start: 2023-10-16 — End: ?

## 2023-10-22 ENCOUNTER — Other Ambulatory Visit: Payer: PRIVATE HEALTH INSURANCE

## 2023-10-22 ENCOUNTER — Telehealth: Payer: Medicare (Managed Care)

## 2023-10-22 ENCOUNTER — Other Ambulatory Visit: Payer: Medicare (Managed Care)

## 2023-10-22 NOTE — Telephone Encounter
 Confirmation Documentation   Patient is scheduled on (10/29/2023) for:   [x]  Colonoscopy   []  Upper Endoscopy   []  Pouchoscopy   []  Upper Endoscopy w/Bravo   []  Upper Endoscopy w/ Esophageal Manometry   []  Upper Endoscopy w/ Esophageal Manometry & pH   []  Upper Endoscopy w/Endoflip   []  Sigmoidoscopy   []  Anoscopy   []  Illeoscopy   []  Small Bowel Enteroscopy   []  Esophageal Manometry   []  Anorectal Manometry   []  pH Study   []  Capsule Endoscopy    Informed patient of the following:   []  Arrival time   []  Location including suite number   []  Transportation   []  Mac Script   []  Does patient have prep instructions?   []  Informed patient to call back should there be any questions?   []  Does procedure order match the case scheduled    NOTE: If patients have any questions regarding prescribed medication patient needs to contact their prescribing physician to ensure it is ok to stop medications.

## 2023-10-24 ENCOUNTER — Other Ambulatory Visit: Payer: BLUE CROSS/BLUE SHIELD

## 2023-10-24 ENCOUNTER — Telehealth: Payer: Medicare (Managed Care)

## 2023-10-24 NOTE — Telephone Encounter
 LM for patient to remind and confirm procedure scheduled for 8/05  Check-in time: 1230P  Procedure time: 130P  Advised patient to take any blood pressure, heart, anti-seizurals and inhaler medications that morning of the procedure. Patient advised if using an inhaler to bring to the procedure. Advised patient they must have a ride home from the procedure with someone that can accompany them home. Medical procedure staff must be able to confirm ride when they arrive or the procedure will be cancelled.    Advised to call back with any questions or concerns.

## 2023-10-29 MED ADMIN — PROPOFOL 200 MG/20ML IV EMUL (ANES): INTRAVENOUS | @ 20:00:00 | Stop: 2023-10-29

## 2023-10-29 MED ADMIN — SODIUM CHLORIDE 0.9 % IV SOLN: INTRAVENOUS | @ 20:00:00 | Stop: 2023-10-29 | NDC 00338004904

## 2023-10-29 MED ADMIN — LIDOCAINE HCL (CARDIAC) 100 MG/5ML IV SOSY: INTRAVENOUS | @ 20:00:00 | Stop: 2023-10-29 | NDC 76329339001

## 2023-10-29 MED ADMIN — PROPOFOL 200 MG/20ML IV EMUL: INTRAVENOUS | @ 20:00:00 | Stop: 2023-10-29 | NDC 63323026929

## 2023-10-29 NOTE — Discharge Instructions
 DISCHARGE INSTRUCTIONS    PROCEDURE: Colonoscopy    FINDINGS: See report    ACTIVITY:  Avoid any strenuous physical activity for the rest of the day  No driving or operating heavy machinery for the rest of the day  No alcoholic beverages for 24 hours (may interact with some medications your received during your procedure)  Do not sign any legal documents after you leave or make any important personal or business decisions until the following day     INSTRUCTIONS:    [x]  Resume your usual diet    []  Specific diet instructions:     [x]  Resume your medications    []  Specific medication instructions:    [x]  No NSAIDS (non-steroidal anti-inflammatory drugs) such as advil, motrin, ibuprofen, aleve, naprosyn, mobic, etc. for at least 14 days.  You do not need to avoid aspirin.       Call [phone] and notify Dr. Therisa Doyne if you experience any of the following:    Severe chest pain, difficulty breathing  Passage of blood clots, black tarry stools, vomiting blood  Severe inflammation at the I.V. site  Difficulty swallowing or persistent vomiting  Abdominal pain  Chills or fever greater than 101 F or 38.4 C within 24 hours of procedure      FOLLOW-UP CARE: If biopsies were taken, contact Dr. Therisa Doyne if you do not receive results within 14 days after the procedure.     IV SEDATION DISCHARGE INSTRUCTIONS ADULT    NOTE: Intermittent nausea, with or without vomiting, is a common side effect following procedure with intravenous sedation.  If this occurs, DO NOT eat or drink anything until it has subsided.  If the nausea or vomiting persists for more than 24 hours contact your physician or go to the nearest emergency room.    ACTIVITY: You may gradually return to your usual activity if not restricted by your proceduralist. Sedative medications may linger in the body for 24-48 hours.  It is not uncommon to feel somewhat drowsy and/or dizzy the remainder of the day following procedure.  Avoid bending over as this may enhance dizziness.  Do not make sudden movements when changing position, such as from a reclining to upright position.      ADDITIONAL INSTRUCTIONS: It is not uncommon to tire easily and/or feel exhausted the day following procedure.  Do not be alarmed as this is normal.  Plan activities that will allow for rest periods as needed.    For your safety, we strongly advise that a responsible adult remain with you for the rest of the procedural day and night.  Also, you should not be responsible for the care of others (children/adults) for 24 hours post anesthesia and a responsible adult should be present to assume these responsibilities.    Understanding Colon and Rectal Polyps    The colon (also called the large intestine) is a muscular tube that forms the last part of the digestive tract. It absorbs water and stores food waste. The colon is about 4 to 6 feet long. The rectum is the last 6 inches of the colon. The colon and rectum have a smooth lining composed of millions of cells. Changes in these cells can lead to growths in the colon that can become cancerous and should be removed. Multiple tests are available to screen for colon cancer, but the colonoscopy is the most recommended test. During colonoscopy, these polyps can be removed. How often you need this test depends on many things including your  condition, your family history, symptoms, and what the findings were at the previous colonoscopy.   When the colon lining changes  Changes that happen in the cells that line the colon or rectum can lead to growths called polyps. Over a period of years, polyps can turn cancerous. Removing polyps early may prevent cancer from ever forming.  Polyps  Polyps are fleshy clumps of tissue that form on the lining of the colon or rectum. Small polyps are usually benign (not cancerous). However, over time, cells in a polyp can change and become cancerous. Certain types of polyps known as adenomatous polyps are premalignant. The risk for invasive cancer increases with the size of the polyp and certain cell and gene features. This means that they can become cancerous if they're not removed. Hyperplastic polyps are benign. They can grow quite large and not turn cancerous.   Cancer  Almost all colorectal cancers start when polyp cells begin growing abnormally. As a cancerous tumor grows, it may involve more and more of the colon or rectum. In time, cancer can also grow beyond the colon or rectum and spread to nearby organs or to glands called lymph nodes. The cells can also travel to other parts of the body. This is known as metastasis. The earlier a cancerous tumor is removed, the better the chance of preventing its spread.    Date Last Reviewed: 10/25/2014  ? 2000-2018 [employer], [place]. [address], [place], [place] [address]. All rights reserved. This information is not intended as a substitute for professional medical care. Always follow your healthcare professional's instructions.          Diverticulosis    Diverticulosis means that small pouches have formed in the wall of your large intestine (colon). Most often, this problem causes no symptoms and is common as people age. But the pouches in the colon are at risk of becoming infected. When this happens, the condition is called diverticulitis. Although most people with diverticulosis never develop diverticulitis, it is still not uncommon. Rectal bleeding can also occur and in less common situations, a type of colon inflammation called colitis.  While most people don't have symptoms, some people with diverticulosis may have:  Abdominal cramps and pain  Bloating  Constipation  Change in bowel habits  Causes  The exact cause of diverticulosis (and diverticulitis) has not been proved, but a few things are associated with the condition:  Low-fiber diet  Constipation  Lack of exercise  Your healthcare provider will talk with you about how to manage your condition. Diet changes may be all that are needed to help control diverticulosis and prevent progression to diverticulitis. If you develop diverticulitis, you will likely need other treatments.  Home care  You may be told to take fiber supplements daily. Fiber adds bulk to the stool so that it passes through the colon more easily. Stool softeners may also be recommended. You may also be given medicines for pain relief. Be sure to take all medicines as directed.  In the past, people were told to avoid corn, nuts, and seeds. This is no longer necessary.  Follow these guidelines when caring for yourself at home:  Eat unprocessed foods that are high in fiber. Whole-grains, fruits, and vegetables are good choices.  Drink 6 to 8 glasses of water every day unless your healthcare provider has you limit how much fluid you should have.  Watch for changes in your bowel movements. Tell your provider if you notice any changes.  Begin an exercise program. Ask your provider how to get started. Generally, walking is the best.  Get plenty of rest and sleep.  Follow-up care  Follow up with your healthcare provider, or as advised. Regular visits may be needed to check on your health. Sometimes special procedures such as colonoscopy, are needed after an episode of diverticulitis or blooding. Be sure to keep all your appointments.  If a stool sample was taken, or cultures were done, you should be told if they are positive, or if your treatment needs to be changed. You can call as directed for the results.  If X-rays were done, a radiologist will look at them. You will be told if there is a change in your treatment.  If antibiotics were prescribed, be sure to finish them all.  When to seek medical advice  Call your healthcare provider right away if any of these occur:  Fever of 100.4?F (38?C) or higher, or as directed by your healthcare provider  Severe cramps in the lower left side of the abdomen or pain that is getting worse  Tenderness in the lower left side of the abdomen or worsening pain throughout the abdomen  Diarrhea or constipation that doesn't get better within 24 hours  Nausea and vomiting  Bleeding from the rectum  Call 911  Call 911 if any of the following occur:  Trouble breathing  Confusion  Very drowsy or trouble awakening  Fainting or loss of consciousness  Rapid heart rate  Chest pain  [employer] last reviewed this educational content on 08/24/2016  ? 2000-2020 [employer], [place]. All rights reserved. This information is not intended as a substitute for professional medical care. Always follow your healthcare professional's instructions.          Hemorrhoids    Hemorrhoids are swollen and inflamed veins inside the rectum and near the anus. The rectum is the last several inches of the colon. The anus is the passage between the rectum and the outside of the body.  Causes  The veins can become swollen due to increased pressure in them. This is most often caused by:  Chronic constipation or diarrhea  Straining when having a bowel movement  Sitting too long on the toilet  A low-fiber diet  Pregnancy  Symptoms  Bleeding from the rectum (this may be noticeable after bowel movements)  Lump near the anus  Itching around the anus  Pain around the anus  There are different types of hemorrhoids. Depending on the type you have and the severity, you may be able to treat yourself at home. In some cases, a procedure may be the best treatment option. Your healthcare provider can tell you more about this, if needed.  Home care  General care  To get relief from pain or itching, try:  Medicines. Your healthcare provider may recommend stool softeners, suppositories, or laxatives to help manage constipation. Use these exactly as directed.  Sitz baths. A sitz bath involves sitting in a few inches of warm bath water. Be careful not to make the water so hot that you burn yourself--test it before sitting in it. Soak for about 10 to 15 minutes a few times a day. This may help relieve pain.  Topical products. Your healthcare provider may prescribe or recommend creams, ointments, or pads that can be applied to the hemorrhoid. Use these exactly as directed.  Tips to help prevent hemorrhoids  Eat more fiber. Fiber adds bulk to stool and absorbs water as it moves  through your colon. This makes stool softer and easier to pass.  Increase the fiber in your diet with more fiber-rich foods. These include fresh fruit, vegetables, and whole grains.  Take a fiber supplement or bulking agent, if advised by your healthcare provider. These include products such as psyllium or methylcellulose.  Drink more water. Your healthcare provider may direct you to drink plenty of water. This can help keep stool soft.  Be more active. Frequent exercise aids digestion and helps prevent constipation. It may also help make bowel movements more regular.  Don?t strain during bowel movements. This can make hemorrhoids more likely. Also, don?t sit on the toilet for long periods of time.  Follow-up care  Follow up with your healthcare provider as advised. If a culture or imaging tests were done, someone will let you know the results when they are ready. This may take a few days or longer. If your healthcare provider recommends a procedure for your hemorrhoids, these options can be discussed. Options may include surgery and outpatient office treatments.  When to seek medical advice  Call your healthcare provider right away if any of these occur:  Increased bleeding from the rectum  Increased pain around the rectum or anus  Weakness or dizziness  Call 911  Call 911 if any of these occur:  Trouble breathing or swallowing  Fainting or loss of consciousness  Unusually fast heart rate  Vomiting blood  Large amounts of blood in stool or black, tarry stools  Date Last Reviewed: 11/25/2015  ? 2000-2018 [employer], [employer]. [address] [place], [place] [address]. All rights reserved. This information is not intended as a substitute for professional medical care. Always follow your healthcare professional's instructions.        Eating a High-Fiber Diet  Fiber is what gives strength and structure to plants. Most grains, beans, vegetables, and fruits contain fiber. Foods rich in fiber are often low in calories and fat, and they fill you up more. They may also reduce your risks for certain health problems. To find out the amount of fiber in canned, packaged, or frozen foods, read the Nutrition Facts label. It tells you how much fiber is in one serving.    Types of fiber and their benefits  There are two types of fiber: insoluble and soluble. They both aid digestion and help you maintain a healthy weight.  Insoluble fiber. This is found in whole grains, cereals, certain fruits and vegetables such as apple skin, corn, and carrots. Insoluble fiber may prevent constipation and reduce the risk for certain types of cancer. It is called insoluble because it does not dissolve in water.  Soluble fiber. This type of fiber is in oats, beans, and certain fruits and vegetables such as strawberries and peas. Soluble fiber can reduce cholesterol, which may help lower the risk for heart disease. It also helps control blood sugar levels.  Look for high-fiber foods  Try these foods to add fiber to your diet:  Whole-grain breads and cereals. Try to eat 6 to 8 ounces a day. Include wheat and oat bran cereals, whole-wheat muffins or toast, and corn tortillas in your meals.  Fruits. Try to eat 2 cups a day. Apples, oranges, strawberries, pears, and bananas are good sources. (Note: Fruit juice is low in fiber.)  Vegetables. Try to eat at least 2.5 cups a day. Add asparagus, carrots, broccoli, peas, and corn to your meals.  Beans. One cup of cooked lentils gives you over 15  grams of fiber. Try navy beans, lentils, and chickpeas.  Seeds. A small handful of seeds gives you about 3 grams of fiber. Try sunflower or chia seeds.  Keep track of your fiber  Keep track of how much fiber you eat. Start by reading food labels. Then eat a variety of foods high in fiber. As you start to eat more fiber, ask your healthcare provider how much water you should be drinking to keep your digestive system working smoothly.  Aim for a certain amount of fiber in your diet each day. If you are a woman, that amount is between 25 and 28 grams per day. Men should aim for 30 to 33 grams per day. After age 32, your daily fiber needs drop to 22 grams for women and 28 grams for men.  Before you reach for the fiber supplements, think about this. Fiber is found naturally in healthy whole foods. It gives you that feeling of fullness after you eat. Taking fiber supplements or eating fiber-enriched foods will not give you this full feeling.  Your fiber intake is a good measure for the quality of your overall diet. If you are missing out on your daily amount of fiber, you may be lacking other important nutrients as well.  Date Last Reviewed: 08/25/2015  ? 2000-2018 [employer], [place]. [address] [place], [place] [address]. All rights reserved. This information is not intended as a substitute for professional medical care. Always follow your healthcare professional's instructions.

## 2023-10-29 NOTE — H&P
 GI PRE-PROCEDURAL HISTORY & PHYSICAL    History of Present Illness:  Joseph Donovan is a 68 y.o.male who is here for polyp surveillance. Last colonoscopy 2020 (hyperplastic polyp at that time).    Past Medical History:  Patient Active Problem List   Diagnosis    Gout of wrist    Hyperlipidemia    IBS (irritable bowel syndrome)    Low vitamin B12 level    Macrocytosis without anemia    Snores    Cyst of kidney, acquired    BMI 31.0-31.9,adult    Reactive airway disease with wheezing    Macrocytic anemia    Alcohol  use    Prediabetes    Tremor of both hands    Obstructive sleep apnea    Epilepsy (HCC/RAF)    Hx of colonic polyp    Seasonal allergies    Skin cancer    HTN (hypertension)    Tortuous aorta    Atherosclerosis of aorta    Lumbar stenosis with neurogenic claudication    Asthma-COPD overlap syndrome (HCC/RAF)       Past Surgical History:  Past Surgical History:   Procedure Laterality Date    COLONOSCOPY  2020    Normal colo at [facility]. Needs to bring in pathology report. Had polyps    EYE SURGERY Bilateral     Lasik    MOHS SURGERY      TONSILLECTOMY         Allergies:  Patient has no known allergies.    Medications:  Medications that the patient states to be currently taking   Medication Sig    ALBUTEROL  90 mcg/act inhaler INHALE 2 PUFFS EVERY FOUR (4) HOURS AS NEEDED FOR WHEEZING OR SHORTNESS OF BREATH.    ALLOPURINOL  300 mg tablet TAKE 1 TABLET (300 MG TOTAL) BY MOUTH DAILY.    budesonide -formoterol  160-4.5 mcg/act inhaler INHALE 2 PUFFS BY MOUTH TWO (2) TIMES DAILY.    cyanocobalamin 1000 mcg tablet Take 1 tablet (1,000 mcg total) by mouth daily.    fish oil  1000 mg capsule Take 1 capsule (1 g total) by mouth daily.    losartan  50 mg tablet Take 1 tablet (50 mg total) by mouth daily.    Multiple Vitamin (MULTIVITAMIN ADULT PO) Take by mouth.    SIMVASTATIN  20 mg tablet TAKE 1 TABLET (20 MG TOTAL) BY MOUTH AT BEDTIME.    tiotropium bromide monohydrate  (SPIRIVA  RESPIMAT) 2.5 mcg/act inhaler Inhale 2 puffs daily.    triamcinolone  0.1% cream Apply topically two (2) times daily. For rash of the arm       Family History:  Family History   Problem Relation Age of Onset    Depression Mother     Meniere's disease Mother     Brain cancer Father 11    Cancer Father         Brain tumor (fatal)       Social History:  Social History     Tobacco Use    Smoking status: Former     Current packs/day: 0.00     Average packs/day: 0.5 packs/day for 30.0 years (15.0 ttl pk-yrs)     Types: Cigarettes     Start date: 03/26/1977     Quit date: 03/27/2007     Years since quitting: 16.6    Smokeless tobacco: Never   Vaping Use    Vaping status: Never Used   Substance Use Topics    Alcohol  use: Yes     Alcohol /week: 2.4 oz  Types: 4 Glasses of Wine (5 oz) per week     Comment: socially    Drug use: Not Currently       Review of Systems: Pertinent positive and negative responses are noted in the history of present illness.     Physical Exam:    Vitals Signs:   Last Recorded Vital Signs:    10/29/23 1242   BP: 146/77   Pulse: 70   Resp: 18   Temp: 37.1 ?C (98.8 ?F)   SpO2: 99%         Normal Exam                                     Additional Findings  [x]  General  [x]  HEENT  [x]  Heart / CVS  [x]  Lungs / Respiratory  [x]  Abdomen      Diagnostic Data:     Lab Results   Component Value Date    HGB 12.0 (L) 10/11/2023    PLT 265 10/11/2023     No results found for: ''PT'', ''INR'', ''APTT''    Results for orders placed or performed in visit on 03/18/19   COVID-19 and Influenza - PCR, Nasopharyngeal    Specimen: Nasopharyngeal; Respiratory, Upper   Result Value Ref Range    Specimen Type Respiratory, Upper     COVID-19 PCR/TMA Not Detected Not Detected    Influenza A PCR Not Detected Not Detected    Influenza B PCR Not Detected Not Detected    Narrative    This test is intended for in vitro diagnostic use under FDA Emergency Use Authorization only. [facility] is certified under the Clinical Laboratory Improvement Amendments of 1988 (CLIA-88) as qualified to perform high complexity clinical laboratory testing.          Assessment:    Joseph Donovan is a 68 y.o.male who is brought to the endoscopy suite for the procedure because of the following: polyp surveillance    Plan:    []  EGD  [x]  Colonoscopy  []  Flexible Sigmoidoscopy    Level of sedation intended for procedure: [x]  MAC  []  IVCS   []  None  ASA Classification: ASA 2 - Patient with mild systemic disease with no functional limitations    I have reviewed the history and physical and have determined Joseph Donovan to be an appropriate candidate to undergo the above procedure(s) with the planned level of sedation.  Patient is cleared for discharge once all discharge criteria is met.        Audie Stayer I. Kinza Gouveia, MD  10/29/2023 12:46 PM

## 2023-10-29 NOTE — Procedures
 [place]  [address],  [place],[address]  CSN: [id number]  COLONOSCOPY Procedure Report  PATIENT NAME: MATHESON, VANDEHEI DATE/TIME OF PROCEDURE: 10/29/2023 / 01:30 PM  DATE OF BIRTH: 10/26/1955 ENDOSCOPIST: Clarke Abelson, MD  RECORD NUMBER: [id number] REFERRING PHYSICIAN:  FELLOW: ,  INDICATION FOR EXAMINATION: Surveillance. History of colon polyps in the past (unknown histology), last colonoscopy  2020, found to have a hyperplastic polyp at that time.  PROCEDURE PERFORMED : COLONOSCOPY  MEDICATIONS: MAC Anesthesia  PROCEDURE TECHNIQUE:  Patient's medications, allergies, past medical, surgical, social and family histories were reviewed and updated as  appropriate. A discussion of informed consent was had with the patient and/or the patient's family prior to the  procedure, including sedation. The alternatives, benefits and risks of the procedure including but not limited to  perforation, hemorrhage, infection, adverse drug reaction and aspiration were discussed  Description of Procedure:  After discussion of informed consent, and appropriate level of sedation were attained, the patient was placed in  the left lateral position. The patient was monitored continuously with pulse oximetry, blood pressure monitoring,  and direct observations.  After digital rectal examination, the colonoscope was inserted into the rectum and advanced under direct vision to  the level of the cecum, confirmed by the appendiceal orifice and ileocecal valve. The quality of the colonic  preparation was good. A careful inspection was made as the colonoscope was withdrawn. Retroflexion was  performed in the rectum. Findings and interventions are described below.  TOTAL WITHDRAWAL TIME: 14 minutes  TOTAL INSERTION TIME: 19 minutes  EXTENT OF EXAM: cecum  INSTRUMENTS: [id number] #[id number] ([id number])  TECHNICALLY DIFFICULT EXAM: No  LIMITATIONS: None TOLERANCE: Good VISUALIZATION: Good  BBPS: LEFT: RIGHT: TRANSVERSE: TOTAL: 0  FINDINGS:  There was a small (< 5 mm) polyp in the ascending colon removed completely/retrieved with cold snare  polypectomy.  There was a small (< 5 mm) polyp in the transverse colon removed completely/retrieved with cold snare  polypectomy.  There was a ~ 2-3 mm flat polyp in the transverse colon removed completely/retrieved with cold biopsy forceps  polypectomy.  There was a small (< 5 mm) polyp in the sigmoid colon removed completely/retrieved with cold snare  polypectomy.  [place]  [address],  [place],[address]  CSN: [id number]  COLONOSCOPY Procedure Report  There was mild sigmoid diverticulosis.  Grade I internal hemorrhoids were seen on retroflexion.  The colonoscopy was otherwise normal.  DIAGNOSIS:  Colon polyps  Colonic diverticulosis  Hemorrhoids  RECOMMENDATIONS:  Follow up biopsy results ([facility] you via [email or web address]). Timing of next colonoscopy will be  recommended based on review of the pathology.  [place]  [address],  [place],[address]  CSN: [id number]  COLONOSCOPY Procedure Report  This note was electronically signed on 08/01/2023, 01:34:25 PM by Shandi Godfrey, MD

## 2023-10-29 NOTE — H&P
UDATED H& REQUIREMENT    For [place] [facility] and [facility]    WHAT IS THE STATUS OF THE ATIENT'S MOST CURRENT HISTORY AND HYSICAL?   - The most current H& was performed within the past 24 hours. No additional updated H& documentation is necessary.     REFER TO MEDICAL STAFF OLICIES REGARDING RE-ROCEDURE HISTORY AND HYSICAL EXAMINATION AND UDATED H& REQUIREMENTS BELOW:    [facility]up R[facility] and [facility] Medical Staff olicy 200 - For atients Undergoing rocedures Requiring Moderate or Deep Sedation, General Anesthesia or Regional Anesthesia    Contents of a History and hysical Examination (H&):    The H& shall consist of chief complaint, history of present illness, allergies and medications, relevant social and family history, past medical history, review of systems and physical examination, and assessment and plan appropriate to the patient's age.    For atients Undergoing rocedures Requiring Moderate or Deep Sedation, General Anesthesia or Regional Anesthesia:    1. An H& shall be performed within 24 hours prior to the procedure by a qualified member of the medical staff or designee with appropriate privileges, except as noted in item 2 below.    2. If a complete history and physical was performed within thirty (30) calendar days prior to the patient's admission to the Medical Center for elective surgery, a member of the medical staff assumes the responsibility for the accuracy of the clinical information and will need to document in the medical record within twenty-four (24) hours of admission and prior to surgery or major invasive procedure, that they either attest that the history and physical has been reviewed and accepted, or document an update of the original history and physical relevant to the patient's current clinical status.    3. roviding an H& for  patients undergoing surgery under local anesthesia is at the discretion of the Attending hysician.     4. When a procedure is performed by a dentist, podiatrist or other practitioner who is not privileged to perform an H&, the anesthesiologist's assessment immediately prior to the procedure will constitute the 24 hour re-assessment.The dentist, podiatrist or other practitioner who is not privileged to perform an H& will document the history and physical relevant to the procedure.    5. If the H& and the written informed consent for the surgery or procedure are not recorded in the patient's medical record prior to surgery, the operation shall not be performed unless the attending physician states in writing that such a delay could lead to an adverse event or irreversible damage to the patient.    6. The above requirements shall not preclude the rendering of emergency medical or surgical care to a patient in dire circumstances.

## 2023-11-02 LAB — Tissue Exam

## 2023-11-13 ENCOUNTER — Inpatient Hospital Stay: Payer: BLUE CROSS/BLUE SHIELD

## 2023-11-13 DIAGNOSIS — Z720 Tobacco use: Secondary | ICD-10-CM

## 2023-11-28 ENCOUNTER — Other Ambulatory Visit: Payer: PRIVATE HEALTH INSURANCE

## 2023-11-29 DIAGNOSIS — Z23 Encounter for immunization: Principal | ICD-10-CM

## 2023-12-03 ENCOUNTER — Telehealth: Payer: Medicare (Managed Care)

## 2023-12-03 MED ORDER — MODERNA COVID-19 VACCINE 100 MCG/0.5ML IM SUSP
.5 mL | Freq: Once | INTRAMUSCULAR | 0 refills | Status: AC
Start: 2023-12-03 — End: ?

## 2023-12-03 NOTE — Telephone Encounter
 Call Back Request      Reason for call back:  Dr. Laurence placed covid vaccine order for Pt. [place] office advised they don't have vaccine Pt was asked to call [facility]. Vaccines are not available at this time. Please assist     Any Symptoms:  []  Yes  [x]  No      If yes, what symptoms are you experiencing:    Duration of symptoms (how long):    Have you taken medication for symptoms (OTC or Rx):      If call was taken outside of clinic hours:    [] Patient or caller has been notified that this message was sent outside of normal clinic hours.     [] Patient or caller has been warm transferred to the physician's answering service. If applicable, patient or caller informed to please call us  back if symptoms progress.  Patient or caller has been notified of the turnaround time of 1-2 business day(s).

## 2023-12-03 NOTE — Addendum Note
 Addended by: LAURENCE MALTA on: 12/03/2023 11:40 AM     Modules accepted: Orders

## 2023-12-03 NOTE — Telephone Encounter
 Spoke with patient , informed him our office doesn't have any vaccines in stock. Asked him to follow up with us  in a couple weeks or in a month for a f/u.SABRA

## 2023-12-12 ENCOUNTER — Other Ambulatory Visit: Payer: BLUE CROSS/BLUE SHIELD

## 2023-12-16 MED ORDER — BUDESONIDE-FORMOTEROL FUMARATE 160-4.5 MCG/ACT IN AERO
1 refills
Start: 2023-12-16 — End: ?

## 2023-12-17 MED ORDER — BUDESONIDE-FORMOTEROL FUMARATE 160-4.5 MCG/ACT IN AERO
RESPIRATORY_TRACT | 2 refills | 30.00000 days | Status: AC
Start: 2023-12-17 — End: ?

## 2023-12-24 ENCOUNTER — Other Ambulatory Visit: Payer: BLUE CROSS/BLUE SHIELD

## 2023-12-25 MED ORDER — ROSUVASTATIN CALCIUM 20 MG PO TABS
20 mg | ORAL_TABLET | Freq: Every evening | ORAL | 1 refills | 90.00000 days | Status: AC
Start: 2023-12-25 — End: ?

## 2024-01-08 ENCOUNTER — Other Ambulatory Visit: Payer: Medicare (Managed Care)

## 2024-01-11 MED ORDER — LOSARTAN POTASSIUM 50 MG PO TABS
50 mg | ORAL_TABLET | Freq: Every day | ORAL | 1 refills
Start: 2024-01-11 — End: ?

## 2024-01-14 MED ORDER — LOSARTAN POTASSIUM 50 MG PO TABS
50 mg | ORAL_TABLET | Freq: Every day | ORAL | 1 refills
Start: 2024-01-14 — End: ?

## 2024-01-15 ENCOUNTER — Ambulatory Visit: Payer: BLUE CROSS/BLUE SHIELD | Attending: Student in an Organized Health Care Education/Training Program

## 2024-01-15 ENCOUNTER — Other Ambulatory Visit: Payer: Medicare (Managed Care)

## 2024-01-15 DIAGNOSIS — I10 Essential (primary) hypertension: Principal | ICD-10-CM

## 2024-01-15 DIAGNOSIS — E785 Hyperlipidemia, unspecified: Secondary | ICD-10-CM

## 2024-01-15 DIAGNOSIS — D649 Anemia, unspecified: Secondary | ICD-10-CM

## 2024-01-15 MED ORDER — LOSARTAN POTASSIUM 25 MG PO TABS
25 mg | ORAL_TABLET | Freq: Every day | ORAL | 1 refills | 90.00000 days | Status: AC
Start: 2024-01-15 — End: ?

## 2024-01-15 NOTE — Patient Instructions
 If I have ordered labs for you, please make sure to confirm with your insurance that they are covered as plan coverage can vary.    Please be aware that I do not typically check messages after hours and on weekends as this portal is for non-urgent messages.  If you have an urgent concern, please call the clinic for assistance.     Wixela, dulera, symbicort , advair 

## 2024-01-15 NOTE — Progress Notes
 [facility] NOTE    PATIENT: Joseph Donovan  MRN: [id number]  DOB: Sep 09, 1955  DATE OF SERVICE: 01/15/2024      PRIMARY CARE PROVIDER: Laurence Malta, MD, MS  REFERRING PROVIDER: No ref. provider found    Chief Complaint   Patient presents with    Follow-up     Follow-up BP check     Patient Active Problem List   Diagnosis    Gout of wrist    Hyperlipidemia    IBS (irritable bowel syndrome)    Low vitamin B12 level    Macrocytosis without anemia    Snores    Cyst of kidney, acquired    BMI 31.0-31.9,adult    Reactive airway disease with wheezing    Macrocytic anemia    Alcohol  use    Prediabetes    Tremor of both hands    Obstructive sleep apnea    Epilepsy (HCC/RAF)    Hx of colonic polyp    Seasonal allergies    Skin cancer    HTN (hypertension)    Tortuous aorta    Atherosclerosis of aorta    Lumbar stenosis with neurogenic claudication    Asthma-COPD overlap syndrome (HCC/RAF)       Subjective:   Joseph Donovan is a 68 y.o. male with above PMH who presents for:    #HTN  - since taking only losartan  50 mg daily BP has gone up  - BP 110-130s/60-70s  - no lightheadedness, dizziness    #Anemia  - on B12 3000 mcg daily  - also taking iron, folic acid in the last couple of months      Past medical, surgical, family, and social history were reviewed and non-contributory except as noted in HPI.    Objective (click to expand/collapse)     Outpatient Medications Marked as Taking for the 01/15/24 encounter (Office Visit) with Laurence Malta, MD, MS   Medication    ALBUTEROL  90 mcg/act inhaler    ALLOPURINOL  300 mg tablet    budesonide -formoterol  160-4.5 mcg/act inhaler    Cobalamin Combinations (B12 FOLATE) 800-800 MCG CAPS    Cyanocobalamin (VITAMIN B-12) 3000 MCG SUBL    cyanocobalamin 1000 mcg tablet    Ferrous Sulfate (IRON PO)    fish oil  1000 mg capsule    losartan  50 mg tablet    LOSARTAN -HYDROCHLOROTHIAZIDE  50-12.5 mg tablet    Multiple Vitamin (MULTIVITAMIN ADULT PO)    SPIKEVAX 50 MCG/0.5ML SUSY    tiotropium bromide monohydrate  (SPIRIVA  RESPIMAT) 2.5 mcg/act inhaler    triamcinolone  0.1% cream    [DISCONTINUED] rosuvastatin 20 mg tablet       No Known Allergies    Review of Systems:  As per HPI    Objective:   BP 132/73  ~ Pulse 75  ~ Temp 36.4 ?C (97.5 ?F) (Forehead)  ~ Ht 6' 1'' (1.854 m)  ~ Wt 187 lb 3.2 oz (84.9 kg)  ~ SpO2 100%  ~ BMI 24.70 kg/m?       System Checked if completed, normal if otherwise not stated Positive or additional negative findings   Constit  []  General appearance     Eyes  []  Conj/Lids []  Pupils  []  Fundi     HENMT  []  External ears/nose []  Otoscopy   []  Gross Hearing []  Nasal mucosa   []  Lips/teeth/gums []  Oropharynx    []  mucus membranes []  Head     Neck  []  Inspection/palpation []  Thyroid  Resp  []  Effort    []  Auscultation       CV  []  Rhythm/rate   []  Murmurs   []  LEE   []  JVP non-elevated    Normal pulses:   []  Radial []  Femoral  []  Pedal     Breast  []  Inspection []  Palpation     GI  []  Bowel sounds/auscultation of abd    []  abd masses    []  tenderness   []  rebound/guarding   []  Liver/spleen []  Rectal     GU  M: []  Scrotum []  Penis []  Prostate   F:  []  External []  vaginal wall        []  Cervix  []  mucus        []  Uterus    []  Adnexa      Lymph  []  Neck []  Axillae []  Groin     MSK Specify site examined:    []  Inspect/palp []  ROM   []  Stability []  Strength/tone         Skin  []  Inspection []  Palpation     Neuro  []  CN2-12 intact grossly   []  Alert and oriented   []  DTR      []  Muscle strength      []  Sensation   []  Gait/balance     Psych  []  Insight/judgement     []  Mood/affect    []  Gross cognition        Lab Studies:   Last CMP:   Results for orders placed or performed in visit on 10/11/23   Comprehensive Metabolic Panel   Result Value Ref Range    Sodium 136 135 - 146 mmol/L    Potassium 3.8 3.6 - 5.3 mmol/L    Chloride 101 96 - 106 mmol/L    Total CO2 24 20 - 30 mmol/L    Anion Gap 11 8 - 19 mmol/L    Glucose 101 (H) 65 - 99 mg/dL    Creatinine 8.84 9.39 - 1.30 mg/dL    Estimated GFR 70 See GFR Additional Information mL/min/1.35m2    GFR Additional Information See Comment     Urea Nitrogen 28 (H) 7 - 22 mg/dL    Calcium 9.3 8.6 - 89.5 mg/dL    Total Protein 6.8 6.1 - 8.2 g/dL    Albumin 4.3 3.9 - 5.0 g/dL    Bilirubin,Total 1.1 0.1 - 1.2 mg/dL    Alkaline Phosphatase 59 37 - 133 U/L    Aspartate Aminotransferase 21 13 - 62 U/L    Alanine Aminotransferase 19 8 - 70 U/L     Last BMP:   Results for orders placed or performed in visit on 05/23/21   Basic Metabolic Panel   Result Value Ref Range    Sodium 139 135 - 146 mmol/L    Potassium 4.8 3.6 - 5.3 mmol/L    Chloride 103 96 - 106 mmol/L    Total CO2 27 20 - 30 mmol/L    Anion Gap 9 8 - 19 mmol/L    Glucose 133 (H) 65 - 99 mg/dL    Creatinine 9.02 9.39 - 1.30 mg/dL    Estimated GFR 87 See GFR Additional Information mL/min/1.50m2    GFR Additional Information See Comment     Urea Nitrogen 16 7 - 22 mg/dL    Calcium 9.2 8.6 - 89.5 mg/dL       Imaging Studies:   None         Assessment/Plan:   Diagnoses  and all orders for this visit:    Primary hypertension  Bps slightly above goal of 130/80.  - increase losartan  to 75 mg daily  -     losartan  25 mg tablet; Take 1 tablet (25 mg total) by mouth daily. Take with the 50 mg tablet for a total of 75 mg daily    Atherosclerosis of aorta  -     Lipid Panel; Future  -     rosuvastatin 20 mg tablet; Take 1 tablet (20 mg total) by mouth at bedtime.    Hyperlipidemia, unspecified hyperlipidemia type  -     Lipid Panel; Future  -     rosuvastatin 20 mg tablet; Take 1 tablet (20 mg total) by mouth at bedtime.    Anemia, unspecified type  -     Ferritin; Future  -     Iron & Iron Binding Capacity; Future  -     Vitamin B12; Future; Expected date: 01/22/2024  -     CBC & Auto Differential; Future  -     Folate,Serum; Future; Expected date: 01/22/2024  -     if no improvement on supplementation will refer to hematology              No follow-ups on file.    Author:  Gari Door, MD, MS 01/15/2024 5:03 PM

## 2024-01-16 ENCOUNTER — Other Ambulatory Visit: Payer: BLUE CROSS/BLUE SHIELD

## 2024-01-16 MED ORDER — ROSUVASTATIN CALCIUM 20 MG PO TABS
20 mg | ORAL_TABLET | Freq: Every evening | ORAL | 1 refills | 90.00000 days | Status: AC
Start: 2024-01-16 — End: ?

## 2024-01-17 DIAGNOSIS — J4540 Moderate persistent asthma, uncomplicated: Principal | ICD-10-CM

## 2024-01-17 MED ORDER — FLUTICASONE-SALMETEROL 250-50 MCG/ACT IN AEPB
1 | Freq: Two times a day (BID) | RESPIRATORY_TRACT | 3 refills | 30.00000 days | Status: AC
Start: 2024-01-17 — End: ?

## 2024-01-30 ENCOUNTER — Ambulatory Visit: Payer: Medicare (Managed Care) | Attending: Student in an Organized Health Care Education/Training Program

## 2024-01-30 DIAGNOSIS — L814 Other melanin hyperpigmentation: Secondary | ICD-10-CM

## 2024-01-30 DIAGNOSIS — L821 Other seborrheic keratosis: Secondary | ICD-10-CM

## 2024-01-30 DIAGNOSIS — L308 Other specified dermatitis: Secondary | ICD-10-CM

## 2024-01-30 DIAGNOSIS — Z85828 Personal history of other malignant neoplasm of skin: Secondary | ICD-10-CM

## 2024-01-30 DIAGNOSIS — D229 Melanocytic nevi, unspecified: Principal | ICD-10-CM

## 2024-01-30 DIAGNOSIS — D1801 Hemangioma of skin and subcutaneous tissue: Secondary | ICD-10-CM

## 2024-01-30 NOTE — Progress Notes
 Dermatology Clinic Note  ?  Chief complaint:   Chief Complaint   Patient presents with    General Skin Check     Referring provider: Laurita Elsie SAUNDERS., MD    ?History of present illness:  Joseph Donovan, a 68 y.o. male presenting for FBSE    No painful, bleeding, rapidly changing lesions  Patient denies lesions of concern    Using SPF 50+ daily    Hx of skin cancer: Hx of NMSC - previously managed by outside Derm    ?PMH/Meds/All/Soc/Fam hx: Reviewed in [facility]     No new changes to PMH/Meds/All/Soc/Fam hx - please refer to today's intake form.  Patient Active Problem List   Diagnosis    Gout of wrist    Hyperlipidemia    IBS (irritable bowel syndrome)    Low vitamin B12 level    Macrocytosis without anemia    Snores    Cyst of kidney, acquired    BMI 31.0-31.9,adult    Reactive airway disease with wheezing    Macrocytic anemia    Alcohol  use    Prediabetes    Tremor of both hands    Obstructive sleep apnea    Epilepsy (HCC/RAF)    Hx of colonic polyp    Seasonal allergies    Skin cancer    HTN (hypertension)    Tortuous aorta    Atherosclerosis of aorta    Lumbar stenosis with neurogenic claudication    Asthma-COPD overlap syndrome (HCC/RAF)     Outpatient Medications Prior to Visit   Medication Sig    ALBUTEROL  90 mcg/act inhaler INHALE 2 PUFFS EVERY FOUR (4) HOURS AS NEEDED FOR WHEEZING OR SHORTNESS OF BREATH.    ALLOPURINOL  300 mg tablet TAKE 1 TABLET (300 MG TOTAL) BY MOUTH DAILY.    Cobalamin Combinations (B12 FOLATE) 800-800 MCG CAPS     Cyanocobalamin (VITAMIN B-12) 3000 MCG SUBL     cyanocobalamin 1000 mcg tablet Take 1 tablet (1,000 mcg total) by mouth daily.    Ferrous Sulfate (IRON PO)     fish oil  1000 mg capsule Take 1 capsule (1 g total) by mouth daily.    fluticasone -salmeterol (WIXELA INHUB ) 250-50 mcg/act diskus inhaler Inhale 1 puff two (2) times daily.    losartan  25 mg tablet Take 1 tablet (25 mg total) by mouth daily. Take with the 50 mg tablet for a total of 75 mg daily    losartan  50 mg tablet Take 1 tablet (50 mg total) by mouth daily.    LOSARTAN -HYDROCHLOROTHIAZIDE  50-12.5 mg tablet TAKE 1 TABLET BY MOUTH DAILY.    Multiple Vitamin (MULTIVITAMIN ADULT PO) Take by mouth.    rosuvastatin 20 mg tablet Take 1 tablet (20 mg total) by mouth at bedtime.    SPIKEVAX 50 MCG/0.5ML SUSY     tiotropium bromide monohydrate  (SPIRIVA  RESPIMAT) 2.5 mcg/act inhaler Inhale 2 puffs daily.    triamcinolone  0.1% cream Apply topically two (2) times daily. For rash of the arm     No facility-administered medications prior to visit.     No Known Allergies  ?  Review of systems:   ?Constitutional: Negative  Skin otherwise Negative  Conjunctiva and Lids: Negative    Objective:   General appearance: well-developed/well nourished for stated age, no apparent distress.  Mental status: alert and oriented.   Mood: cooperative, pleasant.  Affect: normal    A complete examination of skin was performed. This includes examination of the skin of the face, conjunctiva and eyelids,  ears, lips and gums, scalp, hair, neck, chest, breast, axillae, abdomen, back, left and right upper and lower extremities, inguinal folds, hands and feet, toenails and fingernails. The positive findings are listed below.  The remainder of the exam was unremarkable for suspicious lesions.     Exam/Assessment/Plan:    Melanocytic nevi of the trunk - chronic, stable   - Exam: Scattered brown macules with reassuring dermatoscopic features  - Counseled on reassuring morphology today   - Counseled on photoprotection   - Counseled to return to care for any concerning changes to r/o skin cancer    Lentigines - chronic, stable  - Exam: Brown macules with moth-eaten borders  - Counseled on reassuring morphology today   - Counseled on photoprotection   - Counseled to return to care for any concerning changes    Seborrheic keratoses - chronic, stable  - Exam: Brown, stuck-on papules  - Counseled on benign etiology without need for tx given asx    Cherry angiomas - chronic, stable  - Exam: Red macules and papules   - Counseled on benign etiology without need for tx    History of skin cancer  - Scar healing well without evidence of recurrence  - Counseled on photoprotection  - Continue regular FBSEs    Eczematous dermatitis - chronic, uncontrolled  - Exam: Pink scaly papules of the trunk and extremities   - Start TAC 0.1% cream BID; counseled on R/B/SE  - Counseled on gentle skin care    Sun protection strategies and skin cancer risk factors reviewed.   ?  Follow-up in 12 months or earlier PRN    Elsie FABIENE Mana, MD  Dermatology

## 2024-03-10 MED ORDER — ALLOPURINOL 300 MG PO TABS
300 mg | ORAL_TABLET | Freq: Every day | ORAL | 2 refills
Start: 2024-03-10 — End: ?

## 2024-03-12 MED ORDER — ALLOPURINOL 300 MG PO TABS
300 mg | ORAL_TABLET | Freq: Every day | ORAL | 2 refills | Status: AC
Start: 2024-03-12 — End: ?

## 2024-03-12 MED ORDER — LOSARTAN POTASSIUM 50 MG PO TABS
50 mg | ORAL_TABLET | Freq: Every day | ORAL | 1 refills | Status: AC
Start: 2024-03-12 — End: ?

## 2024-03-14 MED ORDER — SPIRIVA RESPIMAT 2.5 MCG/ACT IN AERS
7 refills
Start: 2024-03-14 — End: ?

## 2024-03-16 MED ORDER — SPIRIVA RESPIMAT 2.5 MCG/ACT IN AERS
7 refills
Start: 2024-03-16 — End: ?

## 2024-03-23 ENCOUNTER — Ambulatory Visit: Payer: PRIVATE HEALTH INSURANCE | Attending: Student in an Organized Health Care Education/Training Program

## 2024-04-10 MED ORDER — FLUTICASONE-SALMETEROL 250-50 MCG/ACT IN AEPB
1 | Freq: Two times a day (BID) | RESPIRATORY_TRACT | 5 refills
Start: 2024-04-10 — End: ?

## 2024-04-14 MED ORDER — FLUTICASONE-SALMETEROL 250-50 MCG/ACT IN AEPB
1 | Freq: Two times a day (BID) | RESPIRATORY_TRACT | 0 refills | 30.00000 days | Status: AC
Start: 2024-04-14 — End: ?

## 2024-04-15 MED ORDER — SPIRIVA RESPIMAT 2.5 MCG/ACT IN AERS
7 refills
Start: 2024-04-15 — End: ?

## 2024-04-17 MED ORDER — SPIRIVA RESPIMAT 2.5 MCG/ACT IN AERS
2 | Freq: Every day | RESPIRATORY_TRACT | 7 refills
Start: 2024-04-17 — End: ?

## 2024-04-20 ENCOUNTER — Ambulatory Visit: Payer: BLUE CROSS/BLUE SHIELD

## 2024-04-20 MED ORDER — SPIRIVA RESPIMAT 2.5 MCG/ACT IN AERS
2 | Freq: Every day | RESPIRATORY_TRACT | 3 refills | 30.00000 days | Status: AC
Start: 2024-04-20 — End: ?

## 2024-04-20 MED ORDER — FLUTICASONE-SALMETEROL 500-50 MCG/ACT IN AEPB
1 | Freq: Two times a day (BID) | RESPIRATORY_TRACT | 2 refills | 30.00000 days | Status: AC
Start: 2024-04-20 — End: ?

## 2024-04-20 NOTE — Progress Notes
 Laurence Malta, MD, MS        Joseph Donovan is a 69 y.o. male    Chief Complaint   Patient presents with    Follow-up    Medication Refill        ASSESSMENT AND PLAN  Problem List as of 04/20/2024 Reviewed: 04/27/2021 10:48 AM by Emmett Corwin, MD      Alcohol  use    Asthma-COPD overlap syndrome (HCC/RAF)  Improved on current regimen although still has some chest tightness on cold exposure and secretions.  Amenable for trial of increase in fluticasone  salmeterol 2 500/50, 1 puff twice daily.  Continue Spiriva .    Atherosclerosis of aorta    BMI 31.0-31.9,adult    Cyst of kidney, acquired    Epilepsy (HCC/RAF)    Gout of wrist    HTN (hypertension)    Hx of colonic polyp    Hyperlipidemia    IBS (irritable bowel syndrome)    Low vitamin B12 level    Lumbar stenosis with neurogenic claudication    Macrocytic anemia    Macrocytosis without anemia    Obstructive sleep apnea    Prediabetes    Reactive airway disease with wheezing    Seasonal allergies    Skin cancer    Snores  Tobacco abuse  Repeat CT lung cancer 10/2024    Tortuous aorta    Tremor of both hands     were spent personally by me today on this encounter which include today's pre-visit review of the chart, obtaining appropriate history, performing an evaluation, documentation and discussion of management with details supported within the note for today's visit. The time documented was exclusive of any time spent on the separately billed procedure.          HPI  69 year old last seen 2024 with a history of asthma/COPD overlap.  Stopped smoking approximately 10 years ago.  Has had some cough with clear sputum.  Cold weather tends to increased chest tightness.  Currently on fluticasone /salmeterol plus Spiriva .  No significant dyspnea on exertion.  No nocturnal wakening.      Current Outpatient Medications   Medication Sig    ALBUTEROL  90 mcg/act inhaler INHALE 2 PUFFS EVERY FOUR (4) HOURS AS NEEDED FOR WHEEZING OR SHORTNESS OF BREATH. ALLOPURINOL  300 mg tablet TAKE 1 TABLET (300 MG TOTAL) BY MOUTH DAILY.    Cobalamin Combinations (B12 FOLATE) 800-800 MCG CAPS     Cyanocobalamin (VITAMIN B-12) 3000 MCG SUBL     Ferrous Sulfate (IRON PO)     fish oil  1000 mg capsule Take 1 capsule (1 g total) by mouth daily.    FLUTICASONE -SALMETEROL 250-50 mcg/act diskus inhaler INHALE 1 PUFF TWO (2) TIMES DAILY.    losartan  25 mg tablet Take 1 tablet (25 mg total) by mouth daily. Take with the 50 mg tablet for a total of 75 mg daily    losartan  50 mg tablet Take 1 tablet (50 mg total) by mouth daily.    Multiple Vitamin (MULTIVITAMIN ADULT PO) Take by mouth.    rosuvastatin  20 mg tablet Take 1 tablet (20 mg total) by mouth at bedtime.    triamcinolone  0.1% cream Apply topically two (2) times daily. For rash of the arm    [DISCONTINUED] tiotropium bromide monohydrate  (SPIRIVA  RESPIMAT) 2.5 mcg/act inhaler Inhale 2 puffs daily.    cyanocobalamin 1000 mcg tablet Take 1 tablet (1,000 mcg total) by mouth daily. (Patient not taking: Reported on 04/20/2024)    fluticasone -salmeterol 500-50 mcg/act diskus  inhaler Inhale 1 puff two (2) times daily.    LOSARTAN -HYDROCHLOROTHIAZIDE  50-12.5 mg tablet TAKE 1 TABLET BY MOUTH DAILY. (Patient not taking: Reported on 04/20/2024)    SPIKEVAX 50 MCG/0.5ML SUSY  (Patient not taking: Reported on 04/20/2024)    tiotropium bromide (SPIRIVA  RESPIMAT) 2.5 mcg/act inhaler Inhale 2 puffs daily.    [DISCONTINUED] fluticasone -salmeterol (WIXELA INHUB ) 250-50 mcg/act diskus inhaler Inhale 1 puff two (2) times daily.     No current facility-administered medications for this visit.           ROS:  HEENT:Denies headache, blurred vision, tinnitus, epistaxis  Cardiac: Denies palpitations, valvular heart disease, rheumatic fever  GI: Denies hematemesis, melena, hematochezia  GU: Denies hematuria, frequency, urgency  Musculoskeletal: Denies arthralgias. Arthritis  Endocrine: Denies polyuria, polydipsia  Neurologic: Denies numbness, tingling, weakness  Psychiatric: Denies depression       Physical examination:  Last Recorded Vital Signs:    04/20/24 0858   BP: 122/64   Pulse: 70   Temp: 36.6 ?C (97.9 ?F)   SpO2: 98%       HEENT: Anicteric sclera, conjunctiva pink  Neck: Supple, trachea midline, no adenopathy  Lungs:  Mildly prolonged expiratory phase    Cardiac: Regular rate and rhythm, normal S1 and S2  Abdomen: Soft, nontender  Extremities: No cyanosis, clubbing or edema  Neurologic: Alert, oriented, moves all extremities symmetrically  Dermatologic: No rash  Lymphatics: No palpable adenopathy    Pertinent labs and studies personally reviewed by myself:    Results for orders placed in visit on 10/10/22    XR chest pa+lat (2 views)    Narrative  XR CHEST PA LAT 2V    CLINICAL HISTORY: cough.    COMPARISON: 06/06/2020.    FINDINGS:    Cardiac silhouette top normal in size, query left atrial dilatation; additional enlarged appearance ascending aorta on lateral radiograph. Hyperinflation. Blunting costophrenic sulci may be secondary trace pleural fluid/thickening. Increased interstitial  prominence. No consolidation. No pneumothorax. Degenerative osseous changes.    Impression  Mild hyperinflation. Trace pleural fluid/thickening, correlate pulmonary vascular congestion. Query dilatation ascending thoracic aorta and left atrium, may obtain echocardiography as warranted.        Signed by: Lea Azour   10/10/2022 4:16 PM

## 2024-04-22 NOTE — Progress Notes
 [facility]    PATIENT: Joseph Donovan  MRN: [id number]  DOB: 06-28-1955  DATE OF SERVICE: 04/24/2024      PRIMARY CARE PROVIDER: Laurence Malta, MD, MS  REFERRING PROVIDER: No ref. provider found    No chief complaint on file.    Patient Active Problem List   Diagnosis    Gout of wrist    Hyperlipidemia    IBS (irritable bowel syndrome)    Low vitamin B12 level    Macrocytosis without anemia    Snores    Cyst of kidney, acquired    BMI 31.0-31.9,adult    Reactive airway disease with wheezing    Macrocytic anemia    Alcohol  use    Prediabetes    Tremor of both hands    Obstructive sleep apnea    Epilepsy (HCC/RAF)    Hx of colonic polyp    Seasonal allergies    Skin cancer    HTN (hypertension)    Tortuous aorta    Atherosclerosis of aorta    Lumbar stenosis with neurogenic claudication    Asthma-COPD overlap syndrome (HCC/RAF)       Subjective:   JACOBB Donovan is a 69 y.o. male with above PMH who presents for:    #***    Past medical, surgical, family, and social history were reviewed and non-contributory except as noted in HPI.    Objective (click to expand/collapse)     No outpatient medications have been marked as taking for the 04/24/24 encounter (Appointment) with Laurence Malta, MD, MS.       No Known Allergies    Review of Systems:  As per HPI    Objective:   There were no vitals taken for this visit.      System Checked if completed, normal if otherwise not stated Positive or additional negative findings   Constit  []  General appearance     Eyes  []  Conj/Lids []  Pupils  []  Fundi     HENMT  []  External ears/nose []  Otoscopy   []  Gross Hearing []  Nasal mucosa   []  Lips/teeth/gums []  Oropharynx    []  mucus membranes []  Head     Neck  []  Inspection/palpation []  Thyroid     Resp  []  Effort    []  Auscultation       CV  []  Rhythm/rate   []  Murmurs   []  LEE   []  JVP non-elevated    Normal pulses:   []  Radial []  Femoral  []  Pedal     Breast  []  Inspection []  Palpation     GI  []  Bowel sounds/auscultation of abd    []  abd masses    []  tenderness   []  rebound/guarding   []  Liver/spleen []  Rectal     GU  M: []  Scrotum []  Penis []  Prostate   F:  []  External []  vaginal wall        []  Cervix  []  mucus        []  Uterus    []  Adnexa      Lymph  []  Neck []  Axillae []  Groin     MSK Specify site examined:    []  Inspect/palp []  ROM   []  Stability []  Strength/tone         Skin  []  Inspection []  Palpation     Neuro  []  CN2-12 intact grossly   []  Alert and oriented   []  DTR      []  Muscle  strength      []  Sensation   []  Gait/balance     Psych  []  Insight/judgement     []  Mood/affect    []  Gross cognition        Lab Studies:   {CEG Labs Reviewed:35341}    Imaging Studies:   {CEG Last imaging:35340::''None''}         Assessment/Plan:   There are no diagnoses linked to this encounter.      No follow-ups on file.    Author:  Gari Door, MD, MS 04/22/2024 12:43 PM      ***minutes were spent personally by me today on this encounter which include today's pre-visit review of the chart, obtaining appropriate history, performing an evaluation, documentation and discussion of management with details supported within the note for today's visit. The time documented was exclusive of any time spent on the separately billed procedure.
